# Patient Record
Sex: Male | Born: 1970 | Race: White | Hispanic: No | Marital: Single | Smoking: Current every day smoker
Health system: Southern US, Community
[De-identification: ages and names within clinical notes are randomized; demographics above are authoritative.]

## PROBLEM LIST (undated history)

## (undated) DIAGNOSIS — N200 Calculus of kidney: Secondary | ICD-10-CM

## (undated) DIAGNOSIS — F419 Anxiety disorder, unspecified: Secondary | ICD-10-CM

## (undated) DIAGNOSIS — G709 Myoneural disorder, unspecified: Secondary | ICD-10-CM

## (undated) DIAGNOSIS — K219 Gastro-esophageal reflux disease without esophagitis: Secondary | ICD-10-CM

## (undated) DIAGNOSIS — M62831 Muscle spasm of calf: Secondary | ICD-10-CM

## (undated) DIAGNOSIS — I10 Essential (primary) hypertension: Secondary | ICD-10-CM

## (undated) DIAGNOSIS — E7800 Pure hypercholesterolemia, unspecified: Secondary | ICD-10-CM

## (undated) HISTORY — PX: HAMSTRING LENGTHENING: SHX1722

## (undated) HISTORY — PX: OTHER SURGICAL HISTORY: SHX169

---

## 2003-03-28 ENCOUNTER — Encounter: Payer: Self-pay | Admitting: Emergency Medicine

## 2003-03-28 ENCOUNTER — Emergency Department (HOSPITAL_COMMUNITY): Admission: EM | Admit: 2003-03-28 | Discharge: 2003-03-28 | Payer: Self-pay | Admitting: *Deleted

## 2009-06-15 ENCOUNTER — Emergency Department: Payer: Self-pay | Admitting: Emergency Medicine

## 2011-05-13 ENCOUNTER — Emergency Department (INDEPENDENT_AMBULATORY_CARE_PROVIDER_SITE_OTHER)
Admission: EM | Admit: 2011-05-13 | Discharge: 2011-05-13 | Disposition: A | Discharge status: Home / Self Care | Payer: Self-pay | Source: Home / Self Care | Attending: Emergency Medicine | Admitting: Emergency Medicine

## 2011-05-13 ENCOUNTER — Emergency Department (INDEPENDENT_AMBULATORY_CARE_PROVIDER_SITE_OTHER): Payer: Self-pay

## 2011-05-13 DIAGNOSIS — J40 Bronchitis, not specified as acute or chronic: Secondary | ICD-10-CM

## 2011-05-13 DIAGNOSIS — J329 Chronic sinusitis, unspecified: Secondary | ICD-10-CM

## 2011-05-13 HISTORY — DX: Essential (primary) hypertension: I10

## 2011-05-13 HISTORY — DX: Muscle spasm of calf: M62.831

## 2011-05-13 HISTORY — DX: Pure hypercholesterolemia, unspecified: E78.00

## 2011-05-13 HISTORY — DX: Anxiety disorder, unspecified: F41.9

## 2011-05-13 MED ORDER — ALBUTEROL SULFATE (5 MG/ML) 0.5% IN NEBU
INHALATION_SOLUTION | RESPIRATORY_TRACT | Status: AC
  Filled 2011-05-13: qty 1

## 2011-05-13 MED ORDER — PSEUDOEPHEDRINE-GUAIFENESIN ER 120-1200 MG PO TB12: 1.0000 | ORAL_TABLET | Freq: Two times a day (BID) | ORAL | Status: DC

## 2011-05-13 MED ORDER — IBUPROFEN 800 MG PO TABS
ORAL_TABLET | ORAL | Status: AC
  Filled 2011-05-13: qty 1

## 2011-05-13 MED ORDER — BENZONATATE 100 MG PO CAPS: 100.0000 mg | ORAL_CAPSULE | Freq: Two times a day (BID) | ORAL | Status: AC | PRN

## 2011-05-13 MED ORDER — IBUPROFEN 800 MG PO TABS
800.0000 mg | ORAL_TABLET | Freq: Once | ORAL | Status: AC
  Administered 2011-05-13: 800 mg via ORAL

## 2011-05-13 MED ORDER — DOXYCYCLINE HYCLATE 100 MG PO CAPS: 100.0000 mg | ORAL_CAPSULE | Freq: Two times a day (BID) | ORAL | Status: AC

## 2011-05-13 MED ORDER — IBUPROFEN 600 MG PO TABS: 600.0000 mg | ORAL_TABLET | Freq: Four times a day (QID) | ORAL | Status: AC | PRN

## 2011-05-13 MED ORDER — ALBUTEROL SULFATE (5 MG/ML) 0.5% IN NEBU
5.0000 mg | INHALATION_SOLUTION | Freq: Once | RESPIRATORY_TRACT | Status: AC
  Administered 2011-05-13: 5 mg via RESPIRATORY_TRACT

## 2011-05-13 MED ORDER — ALBUTEROL SULFATE HFA 108 (90 BASE) MCG/ACT IN AERS: 1.0000 | INHALATION_SPRAY | Freq: Four times a day (QID) | RESPIRATORY_TRACT | Status: DC | PRN

## 2011-05-13 MED ORDER — IPRATROPIUM BROMIDE 0.02 % IN SOLN
0.5000 mg | Freq: Once | RESPIRATORY_TRACT | Status: AC
  Administered 2011-05-13: 0.5 mg via RESPIRATORY_TRACT

## 2011-05-13 NOTE — ED Notes (Signed)
This past Monday pt started having a dry cough.  Kept pt up all night.  Tuesday started with low grade fever and every day fever increased and yesterday fever was up to 103.  Pt found some old amoxicillin and started taking them yesterday morning.  Pain in head across eyes.  Pain in throat when coughing.

## 2011-05-13 NOTE — ED Provider Notes (Signed)
History     CSN: [id number] Arrival date & time: 05/13/2011  2:27 PM   First MD Initiated Contact with Patient 05/13/11 1307      Chief Complaint  Patient presents with  . Cough  . Chest Pain  . Fever   HPI Comments: Pt with sinus congestion, frontal HA, rhinorrhea, postnasal drip, ST, nonproductive cough, chest tightness, malaise, chest congestion x 5 days. Unable to sleep at night secondary to cough. Chest soreness after coughing. Reports low grade fevers increasing to 103 yesterday.  No N/v, posttussive emesis, abd pain. Started taking some leftover amoxicillin 500 mg po bid x 2 days, has run out. Last dose this am, Tried fmaily member's albuterol with improvement in chest tightness.. Also taking antihistamine for nasal congestion w/o relief.    Patient is a 40 y.o. male presenting with cough. The history is provided by the patient.  Cough This is a new problem. The current episode started more than 2 days ago. The problem has been gradually worsening. The cough is non-productive. The maximum temperature recorded prior to his arrival was 102 to 102.9 F. Associated symptoms include chest pain, headaches, rhinorrhea, sore throat, myalgias, shortness of breath and wheezing. Pertinent negatives include no chills, no sweats, no ear congestion, no ear pain and no eye redness. He has tried decongestants for the symptoms. The treatment provided no relief. He is not a smoker.    Past Medical History  Diagnosis Date  . Hypertension   . High cholesterol   . Anxiety   . Muscle spasm of calf     Past Surgical History  Procedure Date  . Hamstring lengthening     History reviewed. No pertinent family history.  History  Substance Use Topics  . Smoking status: Never Smoker   . Smokeless tobacco: Current User  . Alcohol Use: No      Review of Systems  Constitutional: Positive for fever. Negative for chills.  HENT: Positive for sore throat, rhinorrhea, sneezing, postnasal drip and  sinus pressure. Negative for ear pain, mouth sores, dental problem and voice change.   Eyes: Negative for redness.  Respiratory: Positive for cough, chest tightness, shortness of breath and wheezing.   Cardiovascular: Positive for chest pain. Negative for palpitations.  Gastrointestinal: Negative for nausea, vomiting, abdominal pain and diarrhea.  Musculoskeletal: Positive for myalgias.  Skin: Negative for rash.  Neurological: Positive for headaches.    Allergies  Review of patient's allergies indicates no known allergies.  Home Medications   Current Outpatient Rx  Name Route Sig Dispense Refill  . ALPRAZOLAM 1 MG PO TABS Oral Take 1 mg by mouth once.      . ATENOLOL PO Oral Take by mouth.      Marland Kitchen BACLOFEN PO Oral Take by mouth.      . FENOFIBRATE PO Oral Take by mouth.      Marland Kitchen LISINOPRIL 20 MG PO TABS Oral Take 20 mg by mouth daily.      . ALBUTEROL SULFATE HFA 108 (90 BASE) MCG/ACT IN AERS Inhalation Inhale 1-2 puffs into the lungs every 6 (six) hours as needed for wheezing. 1 Inhaler 0  . BENZONATATE 100 MG PO CAPS Oral Take 1 capsule (100 mg total) by mouth 2 (two) times daily as needed for cough. 21 capsule 0  . DOXYCYCLINE HYCLATE 100 MG PO CAPS Oral Take 1 capsule (100 mg total) by mouth 2 (two) times daily. X 10 days 20 capsule 0  . IBUPROFEN 600 MG PO TABS Oral  Take 1 tablet (600 mg total) by mouth every 6 (six) hours as needed for pain. 30 tablet 0  . PSEUDOEPHEDRINE-GUAIFENESIN [phone] MG PO TB12 Oral Take 1 tablet by mouth 2 (two) times daily. Prn congestion 20 each 0    BP 115/75  Pulse 89  Temp(Src) 98.4 F (36.9 C) (Oral)  Resp 16  SpO2 99%  Physical Exam  Nursing note and vitals reviewed. Constitutional: He is oriented to person, place, and time. He appears well-developed and well-nourished.  HENT:  Head: Normocephalic and atraumatic.  Right Ear: Tympanic membrane normal.  Left Ear: Tympanic membrane normal.  Nose: Mucosal edema and rhinorrhea present. Right  sinus exhibits frontal sinus tenderness. Right sinus exhibits no maxillary sinus tenderness. Left sinus exhibits frontal sinus tenderness. Left sinus exhibits no maxillary sinus tenderness.  Mouth/Throat: Uvula is midline and mucous membranes are normal. Posterior oropharyngeal erythema present. No oropharyngeal exudate or tonsillar abscesses.  Eyes: Conjunctivae and EOM are normal. Pupils are equal, round, and reactive to light.  Neck: Normal range of motion.  Cardiovascular: Normal rate, regular rhythm, normal heart sounds and intact distal pulses.   Pulmonary/Chest: Effort normal and breath sounds normal. No respiratory distress. He has no wheezes. He has no rales. He exhibits no tenderness.       Coughing, prolonged expiration  Abdominal: He exhibits no distension.  Musculoskeletal: Normal range of motion. He exhibits no edema.  Lymphadenopathy:    He has no cervical adenopathy.  Neurological: He is alert and oriented to person, place, and time.  Skin: Skin is warm and dry.  Psychiatric: He has a normal mood and affect. His behavior is normal.    ED Course  Procedures (including critical care time)  Labs Reviewed - No data to display Dg Chest 2 View  05/13/2011  *RADIOLOGY REPORT*  Clinical Data: Fever and cough.  CHEST - 2 VIEW  Comparison: None.  Findings: Lungs are clear.  Heart size is normal.  No pneumothorax or pleural effusion.  No focal bony abnormality.  IMPRESSION: Negative chest.  Original Report Authenticated By: Bernadene Bell. Maricela Curet, M.D.   The patient was given the following meds in the [facility]: Albuterol/atrivent, motrin   On re-evaluation, pt feels much improved after medication, pt comfortable, VSS. Improved air movement, no wheezing on repeat physical exam.   Discussed imaging results with patient. Emphasized importance of f/u. Pt agrees.    MDM      Luiz Blare, MD 05/13/11 [phone]

## 2011-08-13 ENCOUNTER — Emergency Department (HOSPITAL_COMMUNITY): Payer: Self-pay

## 2011-08-13 ENCOUNTER — Emergency Department (HOSPITAL_COMMUNITY)
Admission: EM | Admit: 2011-08-13 | Discharge: 2011-08-13 | Disposition: A | Discharge status: Home Health | Payer: Self-pay | Attending: Emergency Medicine | Admitting: Emergency Medicine

## 2011-08-13 ENCOUNTER — Encounter (HOSPITAL_COMMUNITY): Payer: Self-pay | Admitting: Emergency Medicine

## 2011-08-13 DIAGNOSIS — I10 Essential (primary) hypertension: Secondary | ICD-10-CM | POA: Insufficient documentation

## 2011-08-13 DIAGNOSIS — E7800 Pure hypercholesterolemia, unspecified: Secondary | ICD-10-CM | POA: Insufficient documentation

## 2011-08-13 DIAGNOSIS — R110 Nausea: Secondary | ICD-10-CM | POA: Insufficient documentation

## 2011-08-13 DIAGNOSIS — R109 Unspecified abdominal pain: Secondary | ICD-10-CM | POA: Insufficient documentation

## 2011-08-13 DIAGNOSIS — N200 Calculus of kidney: Secondary | ICD-10-CM | POA: Insufficient documentation

## 2011-08-13 LAB — DIFFERENTIAL
Basophils Relative: 0 % (ref 0–1)
Eosinophils Absolute: 0.1 10*3/uL (ref 0.0–0.7)
Eosinophils Relative: 1 % (ref 0–5)
Lymphs Abs: 2 10*3/uL (ref 0.7–4.0)
Monocytes Absolute: 1 10*3/uL (ref 0.1–1.0)
Monocytes Relative: 9 % (ref 3–12)

## 2011-08-13 LAB — URINALYSIS, ROUTINE W REFLEX MICROSCOPIC
Ketones, ur: 15 mg/dL — AB
Nitrite: NEGATIVE
Specific Gravity, Urine: 1.029 (ref 1.005–1.030)
pH: 5.5 (ref 5.0–8.0)

## 2011-08-13 LAB — CBC
Hemoglobin: 15.4 g/dL (ref 13.0–17.0)
MCH: 29.9 pg (ref 26.0–34.0)
MCHC: 36.6 g/dL — ABNORMAL HIGH (ref 30.0–36.0)
MCV: 81.7 fL (ref 78.0–100.0)
RBC: 5.15 MIL/uL (ref 4.22–5.81)

## 2011-08-13 LAB — POCT I-STAT, CHEM 8
Calcium, Ion: 1.26 mmol/L (ref 1.12–1.32)
Creatinine, Ser: 1 mg/dL (ref 0.50–1.35)
Glucose, Bld: 121 mg/dL — ABNORMAL HIGH (ref 70–99)
HCT: 45 % (ref 39.0–52.0)
Hemoglobin: 15.3 g/dL (ref 13.0–17.0)
Potassium: 3.9 mEq/L (ref 3.5–5.1)
TCO2: 22 mmol/L (ref 0–100)

## 2011-08-13 LAB — URINE MICROSCOPIC-ADD ON

## 2011-08-13 MED ORDER — TAMSULOSIN HCL 0.4 MG PO CAPS: 0.4000 mg | ORAL_CAPSULE | Freq: Every day | ORAL | Status: DC

## 2011-08-13 MED ORDER — SODIUM CHLORIDE 0.9 % IV BOLUS (SEPSIS)
1000.0000 mL | Freq: Once | INTRAVENOUS | Status: AC
  Administered 2011-08-13: 1000 mL via INTRAVENOUS

## 2011-08-13 MED ORDER — OXYCODONE-ACETAMINOPHEN 5-325 MG PO TABS
1.0000 | ORAL_TABLET | Freq: Four times a day (QID) | ORAL | Status: DC | PRN
Start: 2011-08-13 — End: 2011-08-16

## 2011-08-13 MED ORDER — HYDROMORPHONE HCL PF 1 MG/ML IJ SOLN
1.0000 mg | Freq: Once | INTRAMUSCULAR | Status: AC
  Administered 2011-08-13: 1 mg via INTRAVENOUS
  Filled 2011-08-13: qty 1

## 2011-08-13 MED ORDER — ONDANSETRON HCL 4 MG/2ML IJ SOLN
4.0000 mg | Freq: Once | INTRAMUSCULAR | Status: AC
  Administered 2011-08-13: 4 mg via INTRAVENOUS
  Filled 2011-08-13: qty 2

## 2011-08-13 NOTE — Discharge Instructions (Signed)
Please read and follow all provided instructions.  Your diagnoses today include:  1. Kidney stone     Tests performed today include:  Vital signs. See below for your results today.   CT scan which showed a 9mm x 6mm x 3mm kidney stone on the right side  Blood counts and electrolytes  Kidney function that is normal  Medications prescribed:   Percocet (oxycodone/acetaminophen) - narcotic pain medication  Flomax - smooth muscle relaxer to help you pass stone  Ibuprofen - anti-inflammatory pain medication  Do not exceed 800mg  ibuprofen every 8 hours  If you have been prescribed narcotic pain medication such as Vicodin or Percocet: DO NOT drive or perform any activities that require you to be awake and alert because this medicine can make you drowsy. Do not take any other medications that contain tylenol while taking this medication because you might take too much.   Take any prescribed medications only as directed.  Home care instructions:  Use strainer. Follow any provided educational materials.   Follow-up instructions: Call urologist in the next 3 days for further evaluation of your symptoms. If you do not have a primary care doctor -- see below for referral information.   Return instructions:   Please return to the Emergency Department if you experience worsening symptoms.   Please return if you have any other emergent concerns.  Additional Information:  Your vital signs today were: BP 101/71  Pulse 97  Temp(Src) 98.9 F (37.2 C) (Oral)  Resp 16  Ht 5\' 4"  (1.626 m)  Wt 168 lb (76.204 kg)  BMI 28.84 kg/m2  SpO2 97% If your blood pressure (BP) was elevated above 135/85 this visit, please have this repeated by your doctor within one month. -------------- No Primary Care Doctor Call [facility]  [phone] Other agencies that provide inexpensive medical care    [facility]  [phone]    [facility]  [phone]    [facility]  [phone]    [facility]  [phone]    [facility]  [phone]    [facility]  [phone] -------------- RESOURCE GUIDE:  Dental Problems  Patients with Medicaid: [facility] [phone] [address]                                            [phone] [address] Phone:  [phone]                                                   Phone:  [phone]  If unable to pay or uninsured, contact:  [facility] or [facility] to become qualified for the adult dental clinic.  Chronic Pain Problems Contact [facility]  [phone] Patients need to be referred by their primary care doctor.  Insufficient Money for Medicine Contact [employer]:  call "211" or [facility] [phone].  Psychological Services [facility]  [phone]  [phone] [facility] Mental [facility]   [phone] (emergency services [phone])  Substance Abuse Resources [facility]  [phone] [facility] [phone] [facility] [place] [phone] Floydene Flock [phone] Residential & Outpatient Substance Abuse Program  [phone]  Abuse/Neglect [facility] Child Abuse Hotline 513-716-4844 [facility] Child Abuse Hotline [phone] (After Hours)  Emergency Shelter [facility] [phone]  Maternity Homes Room at the [place] [facility] [place] [phone] [facility] [phone]  [facility]  [facility]     [employer]                          [facility] Dept. [address] [place]                       [address]      [address]  Blondell Reveal Phone:  [phone]                                   Phone:  [phone]                 Phone:   [phone]  [facility] Mental Health Phone:  [phone]  [facility] Child Abuse Hotline [phone] [phone] (After Hours)

## 2011-08-13 NOTE — ED Provider Notes (Signed)
Medical screening examination/treatment/procedure(s) were performed by non-physician practitioner and as supervising physician I was immediately available for consultation/collaboration.  Jasmine Awe, MD 08/13/11 [phone]

## 2011-08-13 NOTE — ED Notes (Signed)
MD at bedside. Josh PA with patient

## 2011-08-13 NOTE — ED Provider Notes (Signed)
History     CSN: [id number]  Arrival date & time 08/13/11  0621   First MD Initiated Contact with Patient 08/13/11 [phone]      Chief Complaint  Patient presents with  . Flank Pain    right flank pain since yesterday.    (Consider location/radiation/quality/duration/timing/severity/associated sxs/prior treatment) HPI Comments: Patient presents with flank pain since yesterday afternoon. Patient complains of right flank pain without radiation that is gradually worsening and waxing and waning. He has taken ibuprofen with some relief. He has a history of kidney stone 5 or 6 years ago. This feels similar. Patient had similar pain that was less intense 5 days ago which resolved. Patient noted some blood in the urine then but has not had any recently. Patient denies burning with urination. He has had some nausea but no vomiting. He denies fever.  Patient is a 41 y.o. male presenting with flank pain. The history is provided by the patient.  Flank Pain This is a new problem. The current episode started yesterday. The problem occurs constantly. The problem has been waxing and waning. Associated symptoms include nausea. Pertinent negatives include no abdominal pain, change in bowel habit, chest pain, chills, congestion, coughing, fever, headaches, myalgias, rash, sore throat, urinary symptoms or vomiting. The symptoms are aggravated by nothing. He has tried NSAIDs for the symptoms. The treatment provided moderate relief.    Past Medical History  Diagnosis Date  . Hypertension   . High cholesterol   . Anxiety   . Muscle spasm of calf     Past Surgical History  Procedure Date  . Hamstring lengthening     History reviewed. No pertinent family history.  History  Substance Use Topics  . Smoking status: Former Games developer  . Smokeless tobacco: Current User  . Alcohol Use: No      Review of Systems  Constitutional: Negative for fever and chills.  HENT: Negative for congestion, sore throat and  rhinorrhea.   Eyes: Negative for redness.  Respiratory: Negative for cough.   Cardiovascular: Negative for chest pain.  Gastrointestinal: Positive for nausea. Negative for vomiting, abdominal pain, diarrhea and change in bowel habit.  Genitourinary: Positive for flank pain. Negative for dysuria, urgency, hematuria, difficulty urinating and testicular pain.  Musculoskeletal: Negative for myalgias.  Skin: Negative for rash.  Neurological: Negative for headaches.    Allergies  Review of patient's allergies indicates no known allergies.  Home Medications   Current Outpatient Rx  Name Route Sig Dispense Refill  . ALBUTEROL SULFATE HFA 108 (90 BASE) MCG/ACT IN AERS Inhalation Inhale 1-2 puffs into the lungs every 6 (six) hours as needed for wheezing. 1 Inhaler 0  . ALPRAZOLAM 1 MG PO TABS Oral Take 1 mg by mouth 3 (three) times daily as needed. anxiety    . ATENOLOL 25 MG PO TABS Oral Take 25 mg by mouth daily.    . CYCLOBENZAPRINE HCL 10 MG PO TABS Oral Take 10 mg by mouth at bedtime. Muscle spasms     . FENOFIBRATE 160 MG PO TABS Oral Take 160 mg by mouth daily.    Marland Kitchen LISINOPRIL-HYDROCHLOROTHIAZIDE 20-25 MG PO TABS Oral Take 1 tablet by mouth daily.    Marland Kitchen PSEUDOEPHEDRINE-GUAIFENESIN ER [phone] MG PO TB12 Oral Take 1 tablet by mouth 2 (two) times daily. Prn congestion 20 each 0    BP 146/106  Pulse 61  Temp(Src) 98.9 F (37.2 C) (Oral)  Resp 18  Ht 5\' 4"  (1.626 m)  Wt 168 lb (76.204 kg)  BMI 28.84 kg/m2  SpO2 99%  Physical Exam  Nursing note and vitals reviewed. Constitutional: He is oriented to person, place, and time. He appears well-developed and well-nourished.  HENT:  Head: Normocephalic and atraumatic.  Eyes: Conjunctivae are normal. Right eye exhibits no discharge. Left eye exhibits no discharge.  Neck: Normal range of motion. Neck supple.  Cardiovascular: Normal rate, regular rhythm and normal heart sounds.   Pulmonary/Chest: Effort normal and breath sounds normal.    Abdominal: Soft. Bowel sounds are normal. There is no tenderness. There is no rebound and no guarding.  Neurological: He is alert and oriented to person, place, and time.  Skin: Skin is warm and dry.  Psychiatric: He has a normal mood and affect.    ED Course  Procedures (including critical care time)  Labs Reviewed  URINALYSIS, ROUTINE W REFLEX MICROSCOPIC - Abnormal; Notable for the following:    Color, Urine AMBER (*) BIOCHEMICALS MAY BE AFFECTED BY COLOR   APPearance CLOUDY (*)    Hgb urine dipstick LARGE (*)    Bilirubin Urine SMALL (*)    Ketones, ur 15 (*)    Protein, ur 30 (*)    Leukocytes, UA SMALL (*)    All other components within normal limits  CBC - Abnormal; Notable for the following:    WBC 11.4 (*)    MCHC 36.6 (*)    All other components within normal limits  DIFFERENTIAL - Abnormal; Notable for the following:    Neutro Abs 8.4 (*)    All other components within normal limits  POCT I-STAT, CHEM 8 - Abnormal; Notable for the following:    Glucose, Bld 121 (*)    All other components within normal limits  URINE MICROSCOPIC-ADD ON - Abnormal; Notable for the following:    Casts HYALINE CASTS (*)    Crystals CA OXALATE CRYSTALS (*)    All other components within normal limits   Ct Abdomen Pelvis Wo Contrast  08/13/2011  *RADIOLOGY REPORT*  Clinical Data: Right flank pain.  History of renal calculi. Hypertension.  CT ABDOMEN AND PELVIS WITHOUT CONTRAST  Technique:  Multidetector CT imaging of the abdomen and pelvis was performed following the standard protocol without intravenous contrast.  Comparison: None.  Findings: There is evidence of old granulomatous disease in the lung bases. The visualized portion of the liver, spleen, pancreas, and adrenal glands appear unremarkable in noncontrast CT appearance.  The gallbladder and biliary system appear unremarkable.  Mild right hydronephrosis and proximal hydroureter are present due to a 0.9 x 0.3 x 0.6 cm right proximal  ureteral calculus shown on image 49 of series 602.  The ureter distal to this stone is of normal caliber, and no left-sided calculi are observed.  Retrocecal appendix extends cephalad and terminates at the inferior tip of the liver; the appendix appears normal.  No pathologic retroperitoneal or porta hepatis adenopathy is identified.  Scattered air-fluid levels in nondilated small bowel are probably incidental  Mild atherosclerotic calcification of the abdominal aorta is present.  No ascites noted.  Bilateral L5 pars interarticularis defects are present, with 4 mm of grade 1 anterolisthesis of L5 on S1 and potential right greater than left foraminal stenosis.  Small central calcifications are present in the prostate gland.  IMPRESSION:  1.  Mildly obstructive right proximal ureteral calculus measuring 0.9 x 0.3 x 0.6 cm. 2.  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  Original Report Authenticated By: Dellia Cloud, M.D.  1. Kidney stone     6:52 AM Patient seen and examined. Work-up initiated. Medications ordered. Patient is not in acute distress currently.   Vital signs reviewed and are as follows: Filed Vitals:   08/13/11 0623  BP: 146/106  Pulse: 61  Temp: 98.9 F (37.2 C)  Resp: 18   9:18 AM Patient d/w Dr. Judd Lien. Patient with large stone on CT. Pain is controlled after one dose of Dilaudid. Patient informed of all results today. Discussed the need for urology followup. Urged patient to strain urine at home. Patient urged to return with worsening severe pain, persistent pain, persistent vomiting, fever, or other concerns. He verbalizes understanding and agrees with the plan.  9:20 AM Patient counseled on use of narcotic pain medications. Counseled not to combine these medications with others containing tylenol. Urged not to drink alcohol, drive, or perform any other activities that requires focus while taking these medications. The patient verbalizes understanding  and agrees with the plan.   MDM  Patient with large, mildly obstructing small right ureteral stone. Pain and nausea controlled well in the emergency department. Renal function is normal. Patient will need urology followup given high probability of being unable to pass the stone. Workup was otherwise unconcerning. Patient is stable at time of discharge home.        Eustace Moore [place], [place] 08/13/11 [phone]

## 2011-08-13 NOTE — ED Notes (Signed)
Assumed patient's care, Scott Marks, skin warm and dry, respiration is even and unlabored. Scott denies any pain at present.

## 2011-08-16 ENCOUNTER — Encounter (HOSPITAL_COMMUNITY): Payer: Self-pay | Admitting: Anesthesiology

## 2011-08-16 ENCOUNTER — Encounter (HOSPITAL_COMMUNITY): Payer: Self-pay | Admitting: *Deleted

## 2011-08-16 ENCOUNTER — Other Ambulatory Visit: Payer: Self-pay | Admitting: Urology

## 2011-08-16 ENCOUNTER — Ambulatory Visit: Admit: 2011-08-16 | Payer: Self-pay | Admitting: Urology

## 2011-08-16 ENCOUNTER — Encounter (HOSPITAL_COMMUNITY)
Admission: EM | Disposition: A | Discharge status: Home / Self Care | Payer: Self-pay | Source: Home / Self Care | Attending: Urology

## 2011-08-16 ENCOUNTER — Inpatient Hospital Stay (HOSPITAL_COMMUNITY): Admission: AD | Admit: 2011-08-16 | Payer: Self-pay | Source: Ambulatory Visit | Admitting: Urology

## 2011-08-16 ENCOUNTER — Inpatient Hospital Stay (HOSPITAL_COMMUNITY): Payer: Self-pay | Admitting: Anesthesiology

## 2011-08-16 ENCOUNTER — Inpatient Hospital Stay (HOSPITAL_COMMUNITY)
Admission: EM | Admit: 2011-08-16 | Discharge: 2011-08-17 | DRG: 694 | Disposition: A | Discharge status: Home / Self Care | Payer: Self-pay | Attending: Urology | Admitting: Urology

## 2011-08-16 DIAGNOSIS — E7800 Pure hypercholesterolemia, unspecified: Secondary | ICD-10-CM | POA: Diagnosis present

## 2011-08-16 DIAGNOSIS — R52 Pain, unspecified: Secondary | ICD-10-CM | POA: Diagnosis present

## 2011-08-16 DIAGNOSIS — N201 Calculus of ureter: Principal | ICD-10-CM | POA: Diagnosis present

## 2011-08-16 DIAGNOSIS — I10 Essential (primary) hypertension: Secondary | ICD-10-CM | POA: Diagnosis present

## 2011-08-16 DIAGNOSIS — Z79899 Other long term (current) drug therapy: Secondary | ICD-10-CM

## 2011-08-16 DIAGNOSIS — F411 Generalized anxiety disorder: Secondary | ICD-10-CM | POA: Diagnosis present

## 2011-08-16 DIAGNOSIS — N1330 Unspecified hydronephrosis: Secondary | ICD-10-CM | POA: Diagnosis present

## 2011-08-16 HISTORY — PX: CYSTOSCOPY W/ URETERAL STENT PLACEMENT: SHX1429

## 2011-08-16 HISTORY — DX: Calculus of kidney: N20.0

## 2011-08-16 LAB — URINALYSIS, ROUTINE W REFLEX MICROSCOPIC
Ketones, ur: NEGATIVE mg/dL
Leukocytes, UA: NEGATIVE
Nitrite: NEGATIVE
pH: 5.5 (ref 5.0–8.0)

## 2011-08-16 LAB — POCT I-STAT, CHEM 8
BUN: 20 mg/dL (ref 6–23)
HCT: 49 % (ref 39.0–52.0)
Hemoglobin: 16.7 g/dL (ref 13.0–17.0)
Sodium: 141 mEq/L (ref 135–145)
TCO2: 24 mmol/L (ref 0–100)

## 2011-08-16 LAB — URINE MICROSCOPIC-ADD ON

## 2011-08-16 SURGERY — CYSTOURETEROSCOPY, WITH RETROGRADE PYELOGRAM AND STENT INSERTION
Anesthesia: General | Laterality: Right

## 2011-08-16 SURGERY — CYSTOSCOPY, WITH RETROGRADE PYELOGRAM AND URETERAL STENT INSERTION
Anesthesia: General | Site: Ureter | Laterality: Right | Wound class: Clean Contaminated

## 2011-08-16 MED ORDER — SODIUM CHLORIDE 0.9 % IV SOLN
Freq: Once | INTRAVENOUS | Status: AC
  Administered 2011-08-16: 75 mL/h via INTRAVENOUS

## 2011-08-16 MED ORDER — HYDROMORPHONE HCL PF 1 MG/ML IJ SOLN
0.5000 mg | INTRAMUSCULAR | Status: DC | PRN
  Administered 2011-08-16: 1 mg via INTRAVENOUS
  Filled 2011-08-16: qty 1

## 2011-08-16 MED ORDER — IOHEXOL 300 MG/ML  SOLN
INTRAMUSCULAR | Status: DC | PRN
  Administered 2011-08-16: 6 mL

## 2011-08-16 MED ORDER — CEFAZOLIN SODIUM-DEXTROSE 2-3 GM-% IV SOLR
2.0000 g | Freq: Once | INTRAVENOUS | Status: DC
  Filled 2011-08-16: qty 50

## 2011-08-16 MED ORDER — FENTANYL CITRATE 0.05 MG/ML IJ SOLN
25.0000 ug | INTRAMUSCULAR | Status: DC | PRN
  Administered 2011-08-16: 25 ug via INTRAVENOUS

## 2011-08-16 MED ORDER — LISINOPRIL-HYDROCHLOROTHIAZIDE 20-25 MG PO TABS: 1.0000 | ORAL_TABLET | Freq: Every day | ORAL | Status: DC

## 2011-08-16 MED ORDER — CEFAZOLIN SODIUM 1-5 GM-% IV SOLN
INTRAVENOUS | Status: DC | PRN
  Administered 2011-08-16: 1 g via INTRAVENOUS

## 2011-08-16 MED ORDER — LISINOPRIL 20 MG PO TABS
20.0000 mg | ORAL_TABLET | Freq: Every day | ORAL | Status: DC
  Filled 2011-08-16: qty 1

## 2011-08-16 MED ORDER — HYDROCHLOROTHIAZIDE 25 MG PO TABS
25.0000 mg | ORAL_TABLET | Freq: Every day | ORAL | Status: DC
  Filled 2011-08-16: qty 1

## 2011-08-16 MED ORDER — HYDROCODONE-ACETAMINOPHEN 5-325 MG PO TABS: 1.0000 | ORAL_TABLET | ORAL | Status: DC | PRN

## 2011-08-16 MED ORDER — DEXAMETHASONE SODIUM PHOSPHATE 10 MG/ML IJ SOLN
INTRAMUSCULAR | Status: DC | PRN
  Administered 2011-08-16: 10 mg via INTRAVENOUS

## 2011-08-16 MED ORDER — LACTATED RINGERS IV SOLN
INTRAVENOUS | Status: DC | PRN
  Administered 2011-08-16: 20:00:00 via INTRAVENOUS

## 2011-08-16 MED ORDER — KETOROLAC TROMETHAMINE 30 MG/ML IJ SOLN
30.0000 mg | Freq: Four times a day (QID) | INTRAMUSCULAR | Status: DC
  Administered 2011-08-16 – 2011-08-17 (×3): 30 mg via INTRAVENOUS
  Filled 2011-08-16 (×11): qty 1

## 2011-08-16 MED ORDER — PROMETHAZINE HCL 25 MG/ML IJ SOLN
25.0000 mg | Freq: Once | INTRAMUSCULAR | Status: AC
  Administered 2011-08-16: 25 mg via INTRAVENOUS
  Filled 2011-08-16: qty 1

## 2011-08-16 MED ORDER — ALPRAZOLAM 1 MG PO TABS
1.0000 mg | ORAL_TABLET | Freq: Three times a day (TID) | ORAL | Status: DC | PRN
  Administered 2011-08-16: 1 mg via ORAL
  Filled 2011-08-16: qty 1

## 2011-08-16 MED ORDER — FENOFIBRATE 160 MG PO TABS
160.0000 mg | ORAL_TABLET | Freq: Every day | ORAL | Status: DC
  Filled 2011-08-16: qty 1

## 2011-08-16 MED ORDER — DEXTROSE-NACL 5-0.45 % IV SOLN
INTRAVENOUS | Status: DC
  Administered 2011-08-16: 75 mL/h via INTRAVENOUS
  Administered 2011-08-17: 02:00:00 via INTRAVENOUS

## 2011-08-16 MED ORDER — HYDROMORPHONE HCL PF 1 MG/ML IJ SOLN
1.0000 mg | INTRAMUSCULAR | Status: DC | PRN
  Administered 2011-08-16 (×3): 1 mg via INTRAVENOUS
  Filled 2011-08-16 (×3): qty 1

## 2011-08-16 MED ORDER — FENTANYL CITRATE 0.05 MG/ML IJ SOLN
INTRAMUSCULAR | Status: DC | PRN
  Administered 2011-08-16: 100 ug via INTRAVENOUS

## 2011-08-16 MED ORDER — PROMETHAZINE HCL 25 MG/ML IJ SOLN: 6.2500 mg | INTRAMUSCULAR | Status: DC | PRN

## 2011-08-16 MED ORDER — CIPROFLOXACIN HCL 500 MG PO TABS
500.0000 mg | ORAL_TABLET | Freq: Two times a day (BID) | ORAL | Status: DC
  Administered 2011-08-16: 500 mg via ORAL
  Filled 2011-08-16 (×3): qty 1

## 2011-08-16 MED ORDER — ATENOLOL 25 MG PO TABS
25.0000 mg | ORAL_TABLET | Freq: Every day | ORAL | Status: DC
  Filled 2011-08-16: qty 1

## 2011-08-16 MED ORDER — CYCLOBENZAPRINE HCL 10 MG PO TABS
10.0000 mg | ORAL_TABLET | Freq: Every day | ORAL | Status: DC
  Filled 2011-08-16: qty 1

## 2011-08-16 MED ORDER — SODIUM CHLORIDE 0.9 % IV SOLN: Freq: Once | INTRAVENOUS | Status: DC

## 2011-08-16 MED ORDER — PROPOFOL 10 MG/ML IV BOLUS
INTRAVENOUS | Status: DC | PRN
  Administered 2011-08-16: 150 mg via INTRAVENOUS

## 2011-08-16 MED ORDER — ONDANSETRON HCL 4 MG/2ML IJ SOLN
INTRAMUSCULAR | Status: DC | PRN
  Administered 2011-08-16: 4 mg via INTRAVENOUS

## 2011-08-16 MED ORDER — ACETAMINOPHEN 10 MG/ML IV SOLN
INTRAVENOUS | Status: DC | PRN
  Administered 2011-08-16: 1000 mg via INTRAVENOUS

## 2011-08-16 MED ORDER — MIDAZOLAM HCL 5 MG/5ML IJ SOLN
INTRAMUSCULAR | Status: DC | PRN
  Administered 2011-08-16: 2 mg via INTRAVENOUS

## 2011-08-16 SURGICAL SUPPLY — 17 items
ADAPTER CATH URET PLST 4-6FR (CATHETERS) ×3 IMPLANT
ADPR CATH URET STRL DISP 4-6FR (CATHETERS) ×2
BAG URO CATCHER STRL LF (DRAPE) ×3 IMPLANT
Boston Scientific Contour (Stent) ×2 IMPLANT
CATH URET 5FR 28IN OPEN ENDED (CATHETERS) ×3 IMPLANT
CATH URET WHISTLE 6FR (CATHETERS) IMPLANT
CLOTH BEACON ORANGE TIMEOUT ST (SAFETY) ×3 IMPLANT
DRAPE CAMERA CLOSED 9X96 (DRAPES) ×3 IMPLANT
GLOVE BIOGEL M 8.0 STRL (GLOVE) ×3 IMPLANT
GLOVE SURG SS PI 8.0 STRL IVOR (GLOVE) ×3 IMPLANT
GOWN PREVENTION PLUS XLARGE (GOWN DISPOSABLE) ×3 IMPLANT
GOWN STRL REIN XL XLG (GOWN DISPOSABLE) ×3 IMPLANT
MANIFOLD NEPTUNE II (INSTRUMENTS) ×3 IMPLANT
MARKER SKIN DUAL TIP RULER LAB (MISCELLANEOUS) ×3 IMPLANT
PACK CYSTO (CUSTOM PROCEDURE TRAY) ×3 IMPLANT
STENT CONTOUR 6FRX24X.038 (STENTS) ×2 IMPLANT
TUBING CONNECTING 10 (TUBING) ×3 IMPLANT

## 2011-08-16 NOTE — ED Notes (Signed)
Pt knows we need a urine sample but is unable at this time 

## 2011-08-16 NOTE — ED Notes (Signed)
Patient with complaints of right sided pain.  Patient has reported large kidney stone. Patient with increased pain, nausea, and dizziness.  Patient was to see Dr Annabell Howells today but could not wait due to pain. Patient states he took percocet x 2 today at 0530 and 0830

## 2011-08-16 NOTE — Op Note (Signed)
Preoperative diagnosis: Obstructing right proximal ureteral stone Postoperative diagnosis: Same  Procedure: Cystoscopy, right retrograde ureteropyelogram, interpreting fluoroscopy, double J stent placement on the) 6 [place] by 24 centimeter contour without string   Surgeon: Bertram Millard. Deunta Beneke, M.D.  Anesthesia: Gen.  Indications: 41 year old male with symptomatic right proximal ureteral stone approximately 8-9 mm in size. Presented to the emergency room, and could not be made comfortable with IV narcotics. He presents at this time for definitive stent placement, most likely to be followed by lithotripsy later date. He is aware risks and complications of stent placement and desires to proceed.     Technique and findings: The patient was identified in the holding area and received preoperative IV antibiotics. He had previously been marked. He was taken the operating room where general anesthetic was administered. He was placed in the dorsolithotomy position. Genitalia and perineum were prepped and draped. Timeout was then performed.  A 22 French panendoscope was advanced under direct vision into the bladder. Urethra was normal, without lesions and there were no lesions within the bladder. Prostate was nonobstructive. The right ureteral orifice was cannulated with a 5 [place] open-ended catheter. Retrograde ureteropyelogram was performed.  The retrograde revealed a normal distal ureter, with a filling defect approximately 1/4 of the way down the ureter. Proximal this, where the stone was located, there was moderate hydronephrosis and pelvocaliectasis.  Following retrograde pyelogram, a sensor-tip guidewire was advanced easily by the obstructing stone, and over top of this, a 24 cm x 6 French contour double-J stent placed. Once adequate curls were seen proximally and distally with fluoroscopic and cystoscopic guidance, the scope was removed once the bladder was drained.  The patient tolerated the  procedure well. He is awakened and taken to PACU in stable condition.

## 2011-08-16 NOTE — ED Notes (Signed)
[facility] at bedside. Report given to [facility] EMT

## 2011-08-16 NOTE — ED Provider Notes (Signed)
History     CSN: [id number]  Arrival date & time 08/16/11  0919   First MD Initiated Contact with Patient 08/16/11 0932      Chief Complaint  Patient presents with  . Nephrolithiasis   Level V caveat for urgent need for intervention because of pain  (Consider location/radiation/quality/duration/timing/severity/associated sxs/prior treatment) HPI Patient relates he was seen here a couple days ago for a right kidney stone. He has appointment today at 11 AM with Dr. Annabell Howells he relates however he started having severe pain at 4 AM this morning in his right flank it's radiating to his right lower quadrant with nausea but no vomiting. He states he's had small kidney stones in the past. He states he took Percocet at 5 AM and 8 AM without improvement of his discomfort.   PCP Dr. Della Goo   Past Medical History  Diagnosis Date  . Hypertension   . High cholesterol   . Anxiety   . Muscle spasm of calf   . Kidney stone     Past Surgical History  Procedure Date  . Hamstring lengthening     No family history on file.  History  Substance Use Topics  . Smoking status: Former Games developer  . Smokeless tobacco: Current User  . Alcohol Use: No  lives with spouse   Review of Systems  Unable to perform ROS   Allergies  Review of patient's allergies indicates no known allergies.  Home Medications   Current Outpatient Rx  Name Route Sig Dispense Refill  . ALPRAZOLAM 1 MG PO TABS Oral Take 1 mg by mouth 3 (three) times daily as needed. anxiety    . ATENOLOL 25 MG PO TABS Oral Take 25 mg by mouth daily.    . CYCLOBENZAPRINE HCL 10 MG PO TABS Oral Take 10 mg by mouth at bedtime. Muscle spasms     . FENOFIBRATE 160 MG PO TABS Oral Take 160 mg by mouth daily.    Marland Kitchen LISINOPRIL-HYDROCHLOROTHIAZIDE 20-25 MG PO TABS Oral Take 1 tablet by mouth daily.    . OXYCODONE-ACETAMINOPHEN 5-325 MG PO TABS Oral Take 1-2 tablets by mouth every 6 (six) hours as needed. For pain    . TAMSULOSIN HCL  0.4 MG PO CAPS Oral Take 1 capsule (0.4 mg total) by mouth daily. 7 capsule 0    BP 139/89  Pulse 59  Temp 98 F (36.7 C)  Resp 26  Ht 5\' 4"  (1.626 m)  Wt 165 lb (74.844 kg)  BMI 28.32 kg/m2  SpO2 100%  Vital signs normal    Physical Exam  Nursing note and vitals reviewed. Constitutional: He is oriented to person, place, and time. He appears well-developed and well-nourished.  Non-toxic appearance. He does not appear ill. He appears distressed.       Patient appears to be very painful, he is holding his right flank  HENT:  Head: Normocephalic and atraumatic.  Right Ear: External ear normal.  Left Ear: External ear normal.  Nose: Nose normal. No mucosal edema or rhinorrhea.  Mouth/Throat: Oropharynx is clear and moist and mucous membranes are normal. No dental abscesses or uvula swelling.  Eyes: Conjunctivae and EOM are normal. Pupils are equal, round, and reactive to light.  Neck: Normal range of motion and full passive range of motion without pain. Neck supple.  Pulmonary/Chest: Effort normal. No respiratory distress. He has no rhonchi. He exhibits no tenderness and no crepitus.  Abdominal: Normal appearance. There is no tenderness. There is no rebound and  no guarding.  Musculoskeletal: Normal range of motion. He exhibits no edema and no tenderness.       Moves all extremities well.   Neurological: He is alert and oriented to person, place, and time. He has normal strength. No cranial nerve deficit.  Skin: Skin is warm, dry and intact. No rash noted. No erythema. There is pallor.  Psychiatric: His speech is normal and behavior is normal. His mood appears not anxious.       Patient is distressed and appears  agitated    ED Course  Procedures (including critical care time)    Medications  oxyCODONE-acetaminophen (PERCOCET) 5-325 MG per tablet (not administered)  HYDROmorphone (DILAUDID) injection 1 mg (1 mg Intravenous Given 08/16/11 1122)  promethazine (PHENERGAN)  injection 25 mg (25 mg Intravenous Given 08/16/11 1004)  0.9 %  sodium chloride infusion (75 mL/hr Intravenous New Bag/Given 08/16/11 1003)  2nd dilaudid 1 mg IV also given  Pt continues to appear painful, holding his side.  12:03 Dr Annabell Howells accepts in transfer to [facility] for admission.   Results for orders placed during the hospital encounter of 08/16/11  POCT I-STAT, CHEM 8      Component Value Range   Sodium 141  135 - 145 (mEq/L)   Potassium 3.6  3.5 - 5.1 (mEq/L)   Chloride 106  96 - 112 (mEq/L)   BUN 20  6 - 23 (mg/dL)   Creatinine, Ser 1.61  0.50 - 1.35 (mg/dL)   Glucose, Bld 096 (*) 70 - 99 (mg/dL)   Calcium, Ion 0.45  4.09 - 1.32 (mmol/L)   TCO2 24  0 - 100 (mmol/L)   Hemoglobin 16.7  13.0 - 17.0 (g/dL)   HCT 81.1  91.4 - 78.2 (%)   Ct Abdomen Pelvis Wo Contrast  08/13/2011  *RADIOLOGY REPORT*  Clinical Data: Right flank pain.  History of renal calculi. Hypertension.  CT ABDOMEN AND PELVIS WITHOUT CONTRAST  Technique:  Multidetector CT imaging of the abdomen and pelvis was performed following the standard protocol without intravenous contrast.  Comparison: None.  Findings: There is evidence of old granulomatous disease in the lung bases. The visualized portion of the liver, spleen, pancreas, and adrenal glands appear unremarkable in noncontrast CT appearance.  The gallbladder and biliary system appear unremarkable.  Mild right hydronephrosis and proximal hydroureter are present due to a 0.9 x 0.3 x 0.6 cm right proximal ureteral calculus shown on image 49 of series 602.  The ureter distal to this stone is of normal caliber, and no left-sided calculi are observed.  Retrocecal appendix extends cephalad and terminates at the inferior tip of the liver; the appendix appears normal.  No pathologic retroperitoneal or porta hepatis adenopathy is identified.  Scattered air-fluid levels in nondilated small bowel are probably incidental  Mild atherosclerotic calcification of the abdominal aorta is  present.  No ascites noted.  Bilateral L5 pars interarticularis defects are present, with 4 mm of grade 1 anterolisthesis of L5 on S1 and potential right greater than left foraminal stenosis.  Small central calcifications are present in the prostate gland.  IMPRESSION:  1.  Mildly obstructive right proximal ureteral calculus measuring 0.9 x 0.3 x 0.6 cm. 2.  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  Original Report Authenticated By: Dellia Cloud, M.D.     1. Ureteral calculus, right     Transfer to [facility] for admission for stent placement  Devoria Albe, MD, FACEP  MDM  Ward Givens, MD 08/16/11 1216

## 2011-08-16 NOTE — ED Notes (Signed)
Pt awake and c/o pain, Dr. Lynelle Doctor was made aware, ordered to give the patient another dose of Dilaudid 1 mg IV prior to transfer.

## 2011-08-16 NOTE — ED Notes (Signed)
Pt was received to POD 3 with c/o rt flank pain radiating to RLQ area onset at 0430. Pt was seen here last Saturday and was diagnosed with kidney stone. Pt noted to be grimacing in pain and c/o nausea.

## 2011-08-16 NOTE — ED Notes (Signed)
Patient remains on monitor with NAD at this time. Patient resting with family at bedside. Patient and family aware of plan of care.

## 2011-08-16 NOTE — H&P (Signed)
Urology Admission H&P  Chief Complaint: Right nephrolithiasis  History of Present Illness: Pt is a 41 yo cauc male who was transferred to [facility] from [facility] ED for treatment of a known right ureteral stone.  Pt states he first began to note pain approx 1 week ago but did not present to the ED for initial eval until 3 days ago.  CT scan revealed a right ureteral stone with mild hydronephrosis and hydroureter.  He was given Flomax, Percocet, and a strainer and instructed to f/u with Dr. Annabell Howells in the office today.  Last night his pain became unbearable despite percocet, therefore, he re-presented to the ED at [facility].  His pain was controlled with Dilaudid and a urology eval was requested.  He has had intermittent nausea with no vomiting and denies F/C.  He has been voiding without difficulty and had only one episode of GH.  He had one stone 4-5 years ago that he passed without intervention.  His pain is currently controlled.  PMH is also significant for HTN, anxiety, and hyperlipidemia.  Past Medical History  Diagnosis Date  . Hypertension   . High cholesterol   . Anxiety   . Muscle spasm of calf   . Kidney stone   Cerbral palsy  Past Surgical History  Procedure Date  . Hamstring lengthening   Repair of testicular torsion  Home Medications:  Prescriptions prior to admission  Medication Sig Dispense Refill  . ALPRAZolam (XANAX) 1 MG tablet Take 1 mg by mouth 3 (three) times daily as needed. anxiety      . atenolol (TENORMIN) 25 MG tablet Take 25 mg by mouth daily.      . cyclobenzaprine (FLEXERIL) 10 MG tablet Take 10 mg by mouth at bedtime. Muscle spasms       . fenofibrate 160 MG tablet Take 160 mg by mouth daily.      Marland Kitchen lisinopril-hydrochlorothiazide (PRINZIDE,ZESTORETIC) 20-25 MG per tablet Take 1 tablet by mouth daily.      Marland Kitchen oxyCODONE-acetaminophen (PERCOCET) 5-325 MG per tablet Take 1-2 tablets by mouth every 6 (six) hours as needed. For pain      . Tamsulosin HCl (FLOMAX) 0.4 MG CAPS Take 1  capsule (0.4 mg total) by mouth daily.  7 capsule  0   Allergies: No Known Allergies  Family History: no hx of stones  Social History:  reports that he has quit smoking. He uses smokeless tobacco. He reports that he does not drink alcohol or use illicit drugs.  Review of Systems  Constitutional: Negative for fever, chills and weight loss.  HENT: Negative.   Eyes: Negative for blurred vision and double vision.  Respiratory: Negative for cough, shortness of breath and wheezing.   Cardiovascular: Negative for chest pain, palpitations and leg swelling.  Gastrointestinal: Positive for nausea and abdominal pain. Negative for vomiting, diarrhea, constipation and blood in stool.       Right sided abd pain  Genitourinary: Positive for hematuria and flank pain. Negative for dysuria, urgency and frequency.       One episode GH Flank pain right  Musculoskeletal: Negative.   Skin: Negative.   Neurological: Negative for dizziness, seizures, loss of consciousness and weakness.  Endo/Heme/Allergies: Negative.   Psychiatric/Behavioral: Negative for depression and hallucinations. The patient is nervous/anxious.     Physical Exam:  Vital signs in last 24 hours: Temp:  [97.5 F (36.4 C)-98 F (36.7 C)] 97.9 F (36.6 C) (02/26 1504) Pulse Rate:  [59-83] 71  (02/26 1504) Resp:  [  16-26] 16  (02/26 1504) BP: (110-139)/(75-93) 123/93 mmHg (02/26 1504) SpO2:  [96 %-100 %] 98 % (02/26 1504) Weight:  [74.844 kg (165 lb)-76.6 kg (168 lb 14 oz)] 76.6 kg (168 lb 14 oz) (02/26 1520) Physical Exam  Constitutional: He is oriented to person, place, and time. He appears well-developed and well-nourished. Distressed: mildly distressed.  HENT:  Head: Normocephalic and atraumatic.  Eyes: EOM are normal. Pupils are equal, round, and reactive to light.  Neck: Normal range of motion. Neck supple. No JVD present. No tracheal deviation present.  Cardiovascular: Normal rate, regular rhythm and normal heart sounds.   Exam reveals no friction rub.   No murmur heard. Respiratory: Effort normal and breath sounds normal. No respiratory distress. He has no wheezes.  GI: Soft. Bowel sounds are normal. He exhibits no distension and no mass. There is no tenderness. There is no rebound and no guarding.  Musculoskeletal: Normal range of motion.  Neurological: He is alert and oriented to person, place, and time. No cranial nerve deficit. Coordination normal.       Mentation slowed by narcotics  Skin: Skin is warm and dry. No rash noted.  Psychiatric: He has a normal mood and affect. His behavior is normal.    Laboratory Data:  Results for orders placed during the hospital encounter of 08/16/11 (from the past 24 hour(s))  POCT I-STAT, CHEM 8     Status: Abnormal   Collection Time   08/16/11 10:16 AM      Component Value Range   Sodium 141  135 - 145 (mEq/L)   Potassium 3.6  3.5 - 5.1 (mEq/L)   Chloride 106  96 - 112 (mEq/L)   BUN 20  6 - 23 (mg/dL)   Creatinine, Ser 1.61  0.50 - 1.35 (mg/dL)   Glucose, Bld 096 (*) 70 - 99 (mg/dL)   Calcium, Ion 0.45  4.09 - 1.32 (mmol/L)   TCO2 24  0 - 100 (mmol/L)   Hemoglobin 16.7  13.0 - 17.0 (g/dL)   HCT 81.1  91.4 - 78.2 (%)  URINALYSIS, ROUTINE W REFLEX MICROSCOPIC     Status: Abnormal   Collection Time   08/16/11  1:18 PM      Component Value Range   Color, Urine YELLOW  YELLOW    APPearance CLEAR  CLEAR    Specific Gravity, Urine 1.025  1.005 - 1.030    pH 5.5  5.0 - 8.0    Glucose, UA NEGATIVE  NEGATIVE (mg/dL)   Hgb urine dipstick LARGE (*) NEGATIVE    Bilirubin Urine NEGATIVE  NEGATIVE    Ketones, ur NEGATIVE  NEGATIVE (mg/dL)   Protein, ur NEGATIVE  NEGATIVE (mg/dL)   Urobilinogen, UA 0.2  0.0 - 1.0 (mg/dL)   Nitrite NEGATIVE  NEGATIVE    Leukocytes, UA NEGATIVE  NEGATIVE   URINE MICROSCOPIC-ADD ON     Status: Abnormal   Collection Time   08/16/11  1:18 PM      Component Value Range   Squamous Epithelial / LPF RARE  RARE    WBC, UA 0-2  <3 (WBC/hpf)     RBC / HPF 21-50  <3 (RBC/hpf)   Bacteria, UA FEW (*) RARE    Casts HYALINE CASTS (*) NEGATIVE    Crystals CA OXALATE CRYSTALS (*) NEGATIVE    Urine-Other MUCOUS PRESENT     Imaging:  Clinical Data: Right flank pain. History of renal calculi.  Hypertension.  CT ABDOMEN AND PELVIS WITHOUT CONTRAST  Technique: Multidetector CT  imaging of the abdomen and pelvis was  performed following the standard protocol without intravenous  contrast.  Comparison: None.  Findings: There is evidence of old granulomatous disease in the  lung bases. The visualized portion of the liver, spleen, pancreas,  and adrenal glands appear unremarkable in noncontrast CT  appearance.  The gallbladder and biliary system appear unremarkable.  Mild right hydronephrosis and proximal hydroureter are present due  to a 0.9 x 0.3 x 0.6 cm right proximal ureteral calculus shown on  image 49 of series 602. The ureter distal to this stone is of  normal caliber, and no left-sided calculi are observed.  Retrocecal appendix extends cephalad and terminates at the inferior  tip of the liver; the appendix appears normal.  No pathologic retroperitoneal or porta hepatis adenopathy is  identified.  Scattered air-fluid levels in nondilated small bowel are probably  incidental  Mild atherosclerotic calcification of the abdominal aorta is  present. No ascites noted.  Bilateral L5 pars interarticularis defects are present, with 4 mm  of grade 1 anterolisthesis of L5 on S1 and potential right greater  than left foraminal stenosis. Small central calcifications are  present in the prostate gland.  IMPRESSION:  1. Mildly obstructive right proximal ureteral calculus measuring  0.9 x 0.3 x 0.6 cm.  2. 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  Original Report Authenticated By: Dellia Cloud, M.D.  Creatinine:  Basename 08/16/11 1016 08/13/11 0656  CREATININE 1.20 1.00    Impression/Assessment:  Right  sided ureteral stone with hydronephrosis/ureter and pain  Plan:  Cysto with right sided DJ stent placement   YARBROUGH,Anamari Galeas G. 08/16/2011, 4:16 PM

## 2011-08-16 NOTE — ED Notes (Signed)
Pt woke up and started c/o pain to the rt flank area radiating to the RLQ area. Pt medicated as ordered, will monitor

## 2011-08-16 NOTE — Anesthesia Preprocedure Evaluation (Signed)
Anesthesia Evaluation  Patient identified by MRN, date of birth, ID band Patient awake    Reviewed: Allergy & Precautions, H&P , NPO status , Patient's Chart, lab work & pertinent test results  Airway Mallampati: II TM Distance: >3 FB Neck ROM: Full    Dental No notable dental hx.    Pulmonary neg pulmonary ROS,  clear to auscultation  Pulmonary exam normal       Cardiovascular hypertension, Pt. on medications and Pt. on home beta blockers Regular Normal    Neuro/Psych Anxiety Negative Neurological ROS     GI/Hepatic negative GI ROS, Neg liver ROS,   Endo/Other  Negative Endocrine ROS  Renal/GU negative Renal ROS  Genitourinary negative   Musculoskeletal negative musculoskeletal ROS (+)   Abdominal   Peds negative pediatric ROS (+)  Hematology negative hematology ROS (+)   Anesthesia Other Findings   Reproductive/Obstetrics negative OB ROS                           Anesthesia Physical Anesthesia Plan  ASA: II  Anesthesia Plan: General   Post-op Pain Management:    Induction: Intravenous  Airway Management Planned: LMA  Additional Equipment:   Intra-op Plan:   Post-operative Plan: Extubation in OR  Informed Consent: I have reviewed the patients History and Physical, chart, labs and discussed the procedure including the risks, benefits and alternatives for the proposed anesthesia with the patient or authorized representative who has indicated his/her understanding and acceptance.   Dental advisory given  Plan Discussed with: CRNA  Anesthesia Plan Comments:         Anesthesia Quick Evaluation

## 2011-08-16 NOTE — Anesthesia Postprocedure Evaluation (Signed)
  Anesthesia Post-op Note  Patient: Scott Marks  Procedure(s) Performed: Procedure(s) (LRB): CYSTOSCOPY WITH RETROGRADE PYELOGRAM/URETERAL STENT PLACEMENT (Right)  Patient Location: PACU  Anesthesia Type: General  Level of Consciousness: awake and alert   Airway and Oxygen Therapy: Patient Spontanous Breathing  Post-op Pain: mild  Post-op Assessment: Post-op Vital signs reviewed, Patient's Cardiovascular Status Stable, Respiratory Function Stable, Patent Airway and No signs of Nausea or vomiting  Post-op Vital Signs: stable  Complications: No apparent anesthesia complications

## 2011-08-16 NOTE — ED Notes (Signed)
Pt is noted to be sleeping, VSS. IVF of NS infusing to RFA, no redness nor swelling noted.

## 2011-08-16 NOTE — ED Notes (Signed)
Report given to Amy RN at [facility]

## 2011-08-16 NOTE — ED Notes (Signed)
Bed Control was called for a bed.

## 2011-08-16 NOTE — Preoperative (Signed)
Beta Blockers   Reason not to administer Beta Blockers:Not Applicable 

## 2011-08-16 NOTE — ED Notes (Signed)
Was seen and examined by Dr. Lynelle Doctor

## 2011-08-16 NOTE — ED Notes (Signed)
Pt is for transfer to [facility] under the service of Dr. Annabell Howells.

## 2011-08-16 NOTE — Anesthesia Preprocedure Evaluation (Deleted)
Anesthesia Evaluation  Patient identified by MRN, date of birth, ID band Patient awake    Reviewed: Allergy & Precautions, H&P , NPO status , Patient's Chart, lab work & pertinent test results  Airway Mallampati: II TM Distance: >3 FB Neck ROM: Full    Dental No notable dental hx.    Pulmonary neg pulmonary ROS,  clear to auscultation  Pulmonary exam normal       Cardiovascular neg cardio ROS Regular Normal    Neuro/Psych Negative Neurological ROS  Negative Psych ROS   GI/Hepatic negative GI ROS, Neg liver ROS,   Endo/Other  Negative Endocrine ROS  Renal/GU negative Renal ROS  Genitourinary negative   Musculoskeletal negative musculoskeletal ROS (+)   Abdominal   Peds negative pediatric ROS (+)  Hematology negative hematology ROS (+)   Anesthesia Other Findings   Reproductive/Obstetrics negative OB ROS                           Anesthesia Physical Anesthesia Plan  ASA: II  Anesthesia Plan: General   Post-op Pain Management:    Induction: Intravenous  Airway Management Planned:   Additional Equipment:   Intra-op Plan:   Post-operative Plan: Extubation in OR  Informed Consent: I have reviewed the patients History and Physical, chart, labs and discussed the procedure including the risks, benefits and alternatives for the proposed anesthesia with the patient or authorized representative who has indicated his/her understanding and acceptance.   Dental advisory given  Plan Discussed with: CRNA  Anesthesia Plan Comments:         Anesthesia Quick Evaluation  

## 2011-08-16 NOTE — ED Notes (Signed)
Pt continues to c/o pain, VSS, medicated as ordered. Will continue to monitor. Informed family that we are waiting for a bed at [facility] for the patient.

## 2011-08-16 NOTE — H&P (Signed)
I examined and interviewed the patient. I agree wihh assessment above. We will proceed with stent placement.

## 2011-08-16 NOTE — Transfer of Care (Signed)
Immediate Anesthesia Transfer of Care Note  Patient: Scott Marks  Procedure(s) Performed: Procedure(s) (LRB): CYSTOSCOPY WITH RETROGRADE PYELOGRAM/URETERAL STENT PLACEMENT (Right)  Patient Location: PACU  Anesthesia Type: General  Level of Consciousness: awake, alert , oriented and patient cooperative  Airway & Oxygen Therapy: Patient Spontanous Breathing and Patient connected to face mask oxygen  Post-op Assessment: Report given to PACU RN and Post -op Vital signs reviewed and stable  Post vital signs: Reviewed and stable  Complications: No apparent anesthesia complications

## 2011-08-17 ENCOUNTER — Encounter (HOSPITAL_COMMUNITY): Payer: Self-pay | Admitting: Anesthesiology

## 2011-08-17 MED ORDER — OXYBUTYNIN CHLORIDE ER 5 MG PO TB24: 5.0000 mg | ORAL_TABLET | Freq: Every day | ORAL | Status: DC

## 2011-08-17 MED ORDER — SULFAMETHOXAZOLE-TRIMETHOPRIM 800-160 MG PO TABS: 1.0000 | ORAL_TABLET | Freq: Two times a day (BID) | ORAL | Status: DC

## 2011-08-17 NOTE — Discharge Summary (Signed)
Patient ID: Nezar Buckles MRN: [id number] DOB/AGE: Sep 09, 1970 41 y.o.  Admit date: 08/16/2011 Discharge date: 08/17/2011  Primary Care Physician:  Ron Parker, MD, MD  Discharge Diagnoses:   Right proximal ureteral stone Consults:  None    Discharge Medications: Medication List  As of 08/17/2011  5:32 AM   ASK your doctor about these medications         ALPRAZolam 1 MG tablet   Commonly known as: XANAX   Take 1 mg by mouth 3 (three) times daily as needed. anxiety      atenolol 25 MG tablet   Commonly known as: TENORMIN   Take 25 mg by mouth daily.      cyclobenzaprine 10 MG tablet   Commonly known as: FLEXERIL   Take 10 mg by mouth at bedtime. Muscle spasms        fenofibrate 160 MG tablet   Take 160 mg by mouth daily.      lisinopril-hydrochlorothiazide 20-25 MG per tablet   Commonly known as: PRINZIDE,ZESTORETIC   Take 1 tablet by mouth daily.      oxyCODONE-acetaminophen 5-325 MG per tablet   Commonly known as: PERCOCET   Take 1-2 tablets by mouth every 6 (six) hours as needed. For pain      Tamsulosin HCl 0.4 MG Caps   Commonly known as: FLOMAX   Take 1 capsule (0.4 mg total) by mouth daily.             Significant Diagnostic Studies:  No results found.  Brief H and P: For complete details please refer to admission H and P, but in brief this 41 year old male was admitted because of intractable pain due to a right proximal ureteral stone.  Hospital Course:  Active Problems:  * No active hospital problems. *    Day of Discharge BP 103/69  Pulse 80  Temp(Src) 97.5 F (36.4 C) (Oral)  Resp 20  Ht 5\' 4"  (1.626 m)  Wt 76.6 kg (168 lb 14 oz)  BMI 28.99 kg/m2  SpO2 92%  Results for orders placed during the hospital encounter of 08/16/11 (from the past 24 hour(s))  POCT I-STAT, CHEM 8     Status: Abnormal   Collection Time   08/16/11 10:16 AM      Component Value Range   Sodium 141  135 - 145 (mEq/L)   Potassium 3.6  3.5 - 5.1 (mEq/L)   Chloride 106  96 - 112 (mEq/L)   BUN 20  6 - 23 (mg/dL)   Creatinine, Ser 4.09  0.50 - 1.35 (mg/dL)   Glucose, Bld 811 (*) 70 - 99 (mg/dL)   Calcium, Ion 9.14  7.82 - 1.32 (mmol/L)   TCO2 24  0 - 100 (mmol/L)   Hemoglobin 16.7  13.0 - 17.0 (g/dL)   HCT 95.6  21.3 - 08.6 (%)  URINALYSIS, ROUTINE W REFLEX MICROSCOPIC     Status: Abnormal   Collection Time   08/16/11  1:18 PM      Component Value Range   Color, Urine YELLOW  YELLOW    APPearance CLEAR  CLEAR    Specific Gravity, Urine 1.025  1.005 - 1.030    pH 5.5  5.0 - 8.0    Glucose, UA NEGATIVE  NEGATIVE (mg/dL)   Hgb urine dipstick LARGE (*) NEGATIVE    Bilirubin Urine NEGATIVE  NEGATIVE    Ketones, ur NEGATIVE  NEGATIVE (mg/dL)   Protein, ur NEGATIVE  NEGATIVE (mg/dL)   Urobilinogen, UA 0.2  0.0 -  1.0 (mg/dL)   Nitrite NEGATIVE  NEGATIVE    Leukocytes, UA NEGATIVE  NEGATIVE   URINE MICROSCOPIC-ADD ON     Status: Abnormal   Collection Time   08/16/11  1:18 PM      Component Value Range   Squamous Epithelial / LPF RARE  RARE    WBC, UA 0-2  <3 (WBC/hpf)   RBC / HPF 21-50  <3 (RBC/hpf)   Bacteria, UA FEW (*) RARE    Casts HYALINE CASTS (*) NEGATIVE    Crystals CA OXALATE CRYSTALS (*) NEGATIVE    Urine-Other MUCOUS PRESENT      Physical Exam: General: Alert and awake oriented x3 not in any acute distress. HEENT: anicteric sclera, pupils reactive to light and accommodation CVS: S1-S2 clear no murmur rubs or gallops Chest: clear to auscultation bilaterally, no wheezing rales or rhonchi Abdomen: soft nontender, nondistended, normal bowel sounds, no organomegaly Extremities: no cyanosis, clubbing or edema noted bilaterally Neuro: Cranial nerves II-XII intact, no focal neurological deficits  Disposition: Home  Diet: No restrictions  Activity: No work until 2/28   Disposition and Follow-up:   We will schedule an appointment the patient followup in the office  TESTS THAT NEED FOLLOW-UP None  DISCHARGE  FOLLOW-UP Follow-up Information    Follow up with Laelyn Blumenthal, Bertram Millard, MD. (we will call you)    Contact information:   [address] [place] [address] [phone]          Time spent on Discharge: 10 minutes  Signed: Chelsea Aus 08/17/2011, 5:32 AM

## 2011-08-17 NOTE — Discharge Instructions (Signed)
1. You may see some blood in the urine and may have some burning with urination for 48-72 hours. You also may notice that you have to urinate more frequently or urgently after your procedure which is normal.   2. You should call should you develop an inability urinate, fever > 101, persistent nausea and vomiting that prevents you from eating or drinking to stay hydrated.   3. If you have a stent, you will likely urinate more frequently and urgently until the stent is removed and you may experience some discomfort/pain in the lower abdomen and flank especially when urinating. You may take pain medication prescribed to you if needed for pain. You may also intermittently have blood in the urine until the stent is removed. well, there usually is not a problem.

## 2011-08-18 ENCOUNTER — Encounter (HOSPITAL_COMMUNITY): Payer: Self-pay | Admitting: Urology

## 2011-08-18 MED FILL — Lidocaine HCl Gel 2%: CUTANEOUS | Qty: 10 | Status: AC

## 2011-08-19 ENCOUNTER — Other Ambulatory Visit: Payer: Self-pay | Admitting: Urology

## 2011-08-25 ENCOUNTER — Encounter (HOSPITAL_COMMUNITY): Payer: Self-pay | Admitting: Pharmacy Technician

## 2011-08-29 ENCOUNTER — Encounter (HOSPITAL_COMMUNITY): Payer: Self-pay

## 2011-08-29 ENCOUNTER — Encounter (HOSPITAL_COMMUNITY)
Admission: RE | Admit: 2011-08-29 | Discharge: 2011-08-29 | Disposition: A | Discharge status: Home / Self Care | Payer: Self-pay | Source: Ambulatory Visit | Attending: Urology | Admitting: Urology

## 2011-08-29 ENCOUNTER — Other Ambulatory Visit: Payer: Self-pay

## 2011-08-29 HISTORY — DX: Gastro-esophageal reflux disease without esophagitis: K21.9

## 2011-08-29 HISTORY — DX: Myoneural disorder, unspecified: G70.9

## 2011-08-29 LAB — CBC
HCT: 41.2 % (ref 39.0–52.0)
Hemoglobin: 14.5 g/dL (ref 13.0–17.0)
MCV: 82.4 fL (ref 78.0–100.0)
RBC: 5 MIL/uL (ref 4.22–5.81)
RDW: 12.5 % (ref 11.5–15.5)
WBC: 8.5 10*3/uL (ref 4.0–10.5)

## 2011-08-29 LAB — BASIC METABOLIC PANEL
BUN: 16 mg/dL (ref 6–23)
CO2: 28 mEq/L (ref 19–32)
Chloride: 103 mEq/L (ref 96–112)
Creatinine, Ser: 0.97 mg/dL (ref 0.50–1.35)
Glucose, Bld: 88 mg/dL (ref 70–99)
Potassium: 3.8 mEq/L (ref 3.5–5.1)

## 2011-08-29 NOTE — Progress Notes (Signed)
STOP BANG TOOL SCORE =  4   08/29/11 1137  OBSTRUCTIVE SLEEP APNEA  Score 4 or greater  Updated health history

## 2011-08-29 NOTE — Progress Notes (Signed)
08/29/11 1137  OBSTRUCTIVE SLEEP APNEA  Score 4 or greater  Updated health history

## 2011-08-29 NOTE — Patient Instructions (Signed)
20 Scott Marks  08/29/2011   Your procedure is scheduled on:  09/02/11 1115am-1245pm  Report to [facility] at 0900 AM.  Call this number if you have problems the morning of surgery: [phone]   Remember:   Do not eat food:After Midnight.  May have clear liquids:until Midnight . Marland Kitchen  Take these medicines the morning of surgery with A SIP OF WATER:    Do not wear jewelry,  Do not wear lotions, powders, or perfumes.     Do not bring valuables to the hospital.  Contacts, dentures or bridgework may not be worn into surgery.      Patients discharged the day of surgery will not be allowed to drive home.  Name and phone number of your driver:   Special Instructions: CHG Shower Use Special Wash: 1/2 bottle night before surgery and 1/2 bottle morning of surgery.   Please read over the following fact sheets that you were given: MRSA Information, coughing and deep breathing exercises, leg exercises

## 2011-09-02 ENCOUNTER — Ambulatory Visit (HOSPITAL_COMMUNITY): Payer: Self-pay | Admitting: Anesthesiology

## 2011-09-02 ENCOUNTER — Encounter (HOSPITAL_COMMUNITY): Payer: Self-pay | Admitting: *Deleted

## 2011-09-02 ENCOUNTER — Encounter (HOSPITAL_COMMUNITY): Payer: Self-pay | Admitting: Anesthesiology

## 2011-09-02 ENCOUNTER — Ambulatory Visit (HOSPITAL_COMMUNITY)
Admission: RE | Admit: 2011-09-02 | Discharge: 2011-09-02 | Disposition: A | Discharge status: Home / Self Care | Payer: Self-pay | Source: Ambulatory Visit | Attending: Urology | Admitting: Urology

## 2011-09-02 ENCOUNTER — Encounter (HOSPITAL_COMMUNITY)
Admission: RE | Disposition: A | Discharge status: Home / Self Care | Payer: Self-pay | Source: Ambulatory Visit | Attending: Urology

## 2011-09-02 DIAGNOSIS — Z79899 Other long term (current) drug therapy: Secondary | ICD-10-CM | POA: Insufficient documentation

## 2011-09-02 DIAGNOSIS — E7800 Pure hypercholesterolemia, unspecified: Secondary | ICD-10-CM | POA: Insufficient documentation

## 2011-09-02 DIAGNOSIS — N201 Calculus of ureter: Secondary | ICD-10-CM | POA: Insufficient documentation

## 2011-09-02 DIAGNOSIS — K219 Gastro-esophageal reflux disease without esophagitis: Secondary | ICD-10-CM | POA: Insufficient documentation

## 2011-09-02 DIAGNOSIS — I10 Essential (primary) hypertension: Secondary | ICD-10-CM | POA: Insufficient documentation

## 2011-09-02 DIAGNOSIS — Z01812 Encounter for preprocedural laboratory examination: Secondary | ICD-10-CM | POA: Insufficient documentation

## 2011-09-02 HISTORY — PX: STONE EXTRACTION WITH BASKET: SHX5318

## 2011-09-02 HISTORY — PX: CYSTOSCOPY WITH URETEROSCOPY: SHX5123

## 2011-09-02 SURGERY — CYSTOSCOPY WITH URETEROSCOPY
Anesthesia: General | Laterality: Right | Wound class: Clean Contaminated

## 2011-09-02 MED ORDER — IOHEXOL 300 MG/ML  SOLN
INTRAMUSCULAR | Status: AC
Start: 2011-09-02 — End: 2011-09-02
  Filled 2011-09-02: qty 1

## 2011-09-02 MED ORDER — LACTATED RINGERS IV SOLN
INTRAVENOUS | Status: DC
  Administered 2011-09-02: 1000 mL via INTRAVENOUS
  Administered 2011-09-02: 14:00:00 via INTRAVENOUS

## 2011-09-02 MED ORDER — FENTANYL CITRATE 0.05 MG/ML IJ SOLN
INTRAMUSCULAR | Status: AC
  Filled 2011-09-02: qty 2

## 2011-09-02 MED ORDER — PROPOFOL 10 MG/ML IV EMUL
INTRAVENOUS | Status: DC | PRN
  Administered 2011-09-02: 200 mg via INTRAVENOUS

## 2011-09-02 MED ORDER — CEFAZOLIN SODIUM 1-5 GM-% IV SOLN
INTRAVENOUS | Status: AC
Start: 2011-09-02 — End: 2011-09-02
  Filled 2011-09-02: qty 50

## 2011-09-02 MED ORDER — OXYBUTYNIN CHLORIDE 5 MG PO TABS: 5.0000 mg | ORAL_TABLET | Freq: Three times a day (TID) | ORAL | Status: DC

## 2011-09-02 MED ORDER — OXYBUTYNIN CHLORIDE 5 MG PO TABS
ORAL_TABLET | ORAL | Status: AC
  Filled 2011-09-02: qty 1

## 2011-09-02 MED ORDER — KETOROLAC TROMETHAMINE 30 MG/ML IJ SOLN
INTRAMUSCULAR | Status: AC
  Filled 2011-09-02: qty 1

## 2011-09-02 MED ORDER — OXYCODONE-ACETAMINOPHEN 5-325 MG PO TABS
ORAL_TABLET | ORAL | Status: AC
  Administered 2011-09-02: 1
  Filled 2011-09-02: qty 1

## 2011-09-02 MED ORDER — MIDAZOLAM HCL 5 MG/ML IJ SOLN
0.5000 mg | INTRAMUSCULAR | Status: DC | PRN
  Administered 2011-09-02 (×2): 0.5 mg via INTRAVENOUS

## 2011-09-02 MED ORDER — FENTANYL CITRATE 0.05 MG/ML IJ SOLN
INTRAMUSCULAR | Status: DC | PRN
  Administered 2011-09-02: 100 ug via INTRAVENOUS
  Administered 2011-09-02 (×2): 25 ug via INTRAVENOUS

## 2011-09-02 MED ORDER — OXYCODONE-ACETAMINOPHEN 5-325 MG PO TABS: 1.0000 | ORAL_TABLET | ORAL | Status: AC | PRN

## 2011-09-02 MED ORDER — MIDAZOLAM HCL 5 MG/ML IJ SOLN
INTRAMUSCULAR | Status: AC
  Filled 2011-09-02: qty 1

## 2011-09-02 MED ORDER — LIDOCAINE HCL 1 % IJ SOLN
INTRAMUSCULAR | Status: DC | PRN
  Administered 2011-09-02: 100 mg via INTRADERMAL

## 2011-09-02 MED ORDER — OXYCODONE-ACETAMINOPHEN 5-325 MG PO TABS: 1.0000 | ORAL_TABLET | ORAL | Status: DC | PRN

## 2011-09-02 MED ORDER — FENTANYL CITRATE 0.05 MG/ML IJ SOLN
25.0000 ug | INTRAMUSCULAR | Status: AC | PRN
  Administered 2011-09-02 (×2): 25 ug via INTRAVENOUS
  Administered 2011-09-02: 50 ug via INTRAVENOUS
  Administered 2011-09-02 (×2): 25 ug via INTRAVENOUS
  Administered 2011-09-02: 50 ug via INTRAVENOUS

## 2011-09-02 MED ORDER — CIPROFLOXACIN IN D5W 400 MG/200ML IV SOLN
INTRAVENOUS | Status: AC
  Filled 2011-09-02: qty 200

## 2011-09-02 MED ORDER — ONDANSETRON HCL 4 MG/2ML IJ SOLN
INTRAMUSCULAR | Status: DC | PRN
  Administered 2011-09-02: 4 mg via INTRAVENOUS

## 2011-09-02 MED ORDER — SODIUM CHLORIDE 0.9 % IR SOLN
Status: DC | PRN
  Administered 2011-09-02: 3000 mL

## 2011-09-02 MED ORDER — MIDAZOLAM HCL 5 MG/5ML IJ SOLN
INTRAMUSCULAR | Status: DC | PRN
  Administered 2011-09-02: 2 mg via INTRAVENOUS

## 2011-09-02 MED ORDER — CEFAZOLIN SODIUM 1-5 GM-% IV SOLN
1.0000 g | INTRAVENOUS | Status: AC
  Administered 2011-09-02: 1 g via INTRAVENOUS

## 2011-09-02 MED ORDER — KETOROLAC TROMETHAMINE 30 MG/ML IJ SOLN
30.0000 mg | Freq: Once | INTRAMUSCULAR | Status: AC
  Administered 2011-09-02: 30 mg via INTRAVENOUS

## 2011-09-02 MED ORDER — ACETAMINOPHEN 10 MG/ML IV SOLN
INTRAVENOUS | Status: DC | PRN
  Administered 2011-09-02: 1000 mg via INTRAVENOUS

## 2011-09-02 MED ORDER — CIPROFLOXACIN HCL 250 MG PO TABS: 250.0000 mg | ORAL_TABLET | Freq: Two times a day (BID) | ORAL | Status: AC

## 2011-09-02 MED ORDER — FENTANYL CITRATE 0.05 MG/ML IJ SOLN: 50.0000 ug | INTRAMUSCULAR | Status: DC | PRN

## 2011-09-02 MED ORDER — IOHEXOL 300 MG/ML  SOLN
INTRAMUSCULAR | Status: DC | PRN
  Administered 2011-09-02: 10 mL

## 2011-09-02 MED ORDER — MIDAZOLAM HCL 2 MG/2ML IJ SOLN: 2.0000 mg | INTRAMUSCULAR | Status: DC | PRN

## 2011-09-02 MED ORDER — ACETAMINOPHEN 10 MG/ML IV SOLN
INTRAVENOUS | Status: AC
  Filled 2011-09-02: qty 100

## 2011-09-02 MED ORDER — CIPROFLOXACIN IN D5W 400 MG/200ML IV SOLN: 400.0000 mg | INTRAVENOUS | Status: DC

## 2011-09-02 SURGICAL SUPPLY — 20 items
ADAPTER CATH URET PLST 4-6FR (CATHETERS) ×2 IMPLANT
ADPR CATH URET STRL DISP 4-6FR (CATHETERS) ×1
BAG URO CATCHER STRL LF (DRAPE) ×2 IMPLANT
BASKET ZERO TIP NITINOL 2.4FR (BASKET) ×1 IMPLANT
BSKT STON RTRVL ZERO TP 2.4FR (BASKET) ×1
CATH INTERMIT  6FR 70CM (CATHETERS) IMPLANT
CATH URET 5FR 28IN OPEN ENDED (CATHETERS) ×2 IMPLANT
CLOTH BEACON ORANGE TIMEOUT ST (SAFETY) ×2 IMPLANT
DRAPE CAMERA CLOSED 9X96 (DRAPES) ×2 IMPLANT
GLOVE BIOGEL M 8.0 STRL (GLOVE) ×2 IMPLANT
GLOVE SURG SS PI 8.0 STRL IVOR (GLOVE) ×2 IMPLANT
GOWN PREVENTION PLUS XLARGE (GOWN DISPOSABLE) ×2 IMPLANT
GOWN STRL REIN XL XLG (GOWN DISPOSABLE) ×2 IMPLANT
LASER FIBER DISP (UROLOGICAL SUPPLIES) ×1 IMPLANT
MANIFOLD NEPTUNE II (INSTRUMENTS) ×2 IMPLANT
MARKER SKIN DUAL TIP RULER LAB (MISCELLANEOUS) ×2 IMPLANT
PACK CYSTO (CUSTOM PROCEDURE TRAY) ×2 IMPLANT
SHEATH ACCESS URETERAL 38CM (SHEATH) ×1 IMPLANT
STENT CONTOUR 6FRX24X.038 (STENTS) ×1 IMPLANT
TUBING CONNECTING 10 (TUBING) ×2 IMPLANT

## 2011-09-02 NOTE — Discharge Instructions (Signed)
POSTOPERATIVE CARE AFTER URETEROSCOPY  Stent management  *Stents are often left in after ureteroscopy and stone treatment. If left in, they often cause urinary frequency, urgency, occasional blood in the urine, as well as flank discomfort with urination. These are all expected issues, and should resolve after the stent is removed.  *Often times, a small thread is left on the end of the stent, and brought out through the urethra. If so, this is used to remove the stent, making it unnecessary to look in the bladder with a scope in the office to remove the stent. If a thread is left on, did not pull on it until instructed. You may pull the stent out on Monday morning. Following stent removal, you'll more than likely have quite a few small stone fragments coming out. Strained your urine, save fragments.  Diet  Once you have adequately recovered from anesthesia, you may gradually advance your diet, as tolerated, to your regular diet.  Activities  You may gradually increase your activities to your normal unrestricted level the day following your procedure.  Medications  You should resume all preoperative medications. If you are on aspirin-like compounds, you should not resume these until the blood clears from your urine. If given an antibiotic by the surgeon, take these until they are completed. You may also be given, if you have a stent, medications to decrease the urinary frequency and urgency.  Pain  After ureteroscopy, there may be some pain on the side of the scope. Take your pain medicine for this. Usually, this pain resolves within a day or 2.  Fever  Please report any fever over 100 to the doctor.

## 2011-09-02 NOTE — Addendum Note (Signed)
Addendum  created 09/02/11 1329 by Gaetano Hawthorne, MD   Modules edited:Orders

## 2011-09-02 NOTE — Op Note (Signed)
Preoperative diagnosis: Right proximal ureteral stone  Postoperative diagnosis: Same  Principal procedure: Cystoscopy, right ureteroscopy-rigid and flexible, holmium laser of right proximal ureteral stone, double-J stent placement (24 cm x 6 [place] with tether)  Surgeon: Sohail Capraro  Anesthesia: Gen. with LMA  Drains: 24 x 6 French double-J stent  Complications: None  Specimens: None  Indications: 41 year old male with symptomatic right proximal ureteral stone, requiring stenting approximately 3 weeks ago. He presents at this time for definitive management of his stone. Risks and complications of the procedure of cystoscopy, right ureteroscopy and holmium laser/possible extraction of stone and been discussed with the patient. He understands these and desires to proceed.  Description of procedure: The patient was identified, properly marked in the holding area, and received preoperative IV antibiotics. He was taken to the operating room where general anesthetic was administered with the LMA. He was placed in the dorsolithotomy position. Genitalia and perineum were prepped and draped, time out was performed.  I passed a 22 French cystoscope through his urethra and into the bladder, which was normal except for the right double-J stent seen coming to the right ureteral orifice. The stent was grasped and removed through the patient's urethral meatus. I then passed a guidewire through this double-J stent, using fluoroscopic guidance this was navigated to the right renal pelvis where good curl was seen. Following this, a 6 [place] long rigid ureteroscope was passed under direct vision, and the stone at the proximal ureter was encountered. Is slight stenosis just distal to this stone. It was felt that the stone was too large to be extracted, and I used the 365  fiber to fragment the stone into 3 fragments. Unfortunately, these were washed up into the renal pelvis, and I was unable to adequately fragment  them or grasped them with the rigid scope. I then passed a medium length ureteral access sheath over the guidewire, and passed the flexible ureteroscope into the right renal pelvis could be sheath. The stone was encountered, actually 2-3 fragments, in the upper pole calyces. I used the laser fiber to fragment these into multiple small fragments. The fragments were approximately 6-10 in number, and I felt that they would pass adequately, without having to be extracted. Because of the slight ureteral narrowing distal to where the stone was, I left the double-J stent and over a guidewire that had been placed through the ureteral access catheter. The stent was advanced using fluoroscopic assistance guidance. Good proximal and distal coils were seen using fluoroscopy and cystoscopy. The string was left on and brought through the urethra. The bladder had been drained appropriately.  The patient tolerated procedure well. He was awakened, extubated, and taken to the PACU in stable condition. He will be sent home with 25 Percocet tablets as well as 10 Cipro 250 mg tablets to take. I recommended that he remove the stent by himself in approximately 3 days, Monday morning.

## 2011-09-02 NOTE — Anesthesia Preprocedure Evaluation (Addendum)
Anesthesia Evaluation  Patient identified by MRN, date of birth, ID band Patient awake    Reviewed: Allergy & Precautions, H&P , NPO status , Patient's Chart, lab work & pertinent test results, reviewed documented beta blocker date and time   Airway Mallampati: II TM Distance: >3 FB Neck ROM: full    Dental  (+) Teeth Intact and Dental Advisory Given   Pulmonary neg pulmonary ROS,  breath sounds clear to auscultation  Pulmonary exam normal       Cardiovascular Exercise Tolerance: Good hypertension, Pt. on home beta blockers negative cardio ROS  Rhythm:regular Rate:Normal     Neuro/Psych Anxiety negative neurological ROS  negative psych ROS   GI/Hepatic negative GI ROS, Neg liver ROS, GERD-  Controlled,  Endo/Other  negative endocrine ROS  Renal/GU negative Renal ROS  negative genitourinary   Musculoskeletal   Abdominal   Peds  Hematology negative hematology ROS (+)   Anesthesia Other Findings   Reproductive/Obstetrics negative OB ROS                          Anesthesia Physical Anesthesia Plan  ASA: II  Anesthesia Plan: General   Post-op Pain Management:    Induction: Intravenous  Airway Management Planned: LMA  Additional Equipment:   Intra-op Plan:   Post-operative Plan:   Informed Consent: I have reviewed the patients History and Physical, chart, labs and discussed the procedure including the risks, benefits and alternatives for the proposed anesthesia with the patient or authorized representative who has indicated his/her understanding and acceptance.   Dental Advisory Given  Plan Discussed with: CRNA and Surgeon  Anesthesia Plan Comments:         Anesthesia Quick Evaluation

## 2011-09-02 NOTE — Progress Notes (Signed)
Pt has no dressing but pt does have string noted from his penis.  Pt did urinate and urine noted to be bloody with some clots.

## 2011-09-02 NOTE — Preoperative (Signed)
Beta Blockers   Reason not to administer Beta Blockers:Not Applicable 

## 2011-09-02 NOTE — Anesthesia Postprocedure Evaluation (Signed)
  Anesthesia Post-op Note  Patient: Scott Marks  Procedure(s) Performed: Procedure(s) (LRB): CYSTOSCOPY WITH URETEROSCOPY (Right) HOLMIUM LASER APPLICATION (Right) STONE EXTRACTION WITH BASKET (Right)  Patient Location: PACU  Anesthesia Type: General  Level of Consciousness: awake and alert   Airway and Oxygen Therapy: Patient Spontanous Breathing  Post-op Pain: mild  Post-op Assessment: Post-op Vital signs reviewed, Patient's Cardiovascular Status Stable, Respiratory Function Stable, Patent Airway and No signs of Nausea or vomiting  Post-op Vital Signs: stable  Complications: No apparent anesthesia complications

## 2011-09-02 NOTE — H&P (Signed)
Urology History and Physical Exam  CC: Right-sided ureteral stone  HPI: 41 year old Male presents at this time for definitive management of a right proximal ureteral stone. He presented to the emergency room at [facility] in late February, 2013 with an obstructing 9 mm proximal right ureteral stone. He was difficult to manage medically, as he had intractable pain despite high-dose narcotics. He underwent cystoscopy and double-J stent placement. He presents at this time for definitive management of the stone i.e. Ureteroscopy with holmium laser and extraction of ureteral stone.  He is aware of risks and complications of the procedure, and desires to proceed.  PMH: Past Medical History  Diagnosis Date  . Hypertension   . High cholesterol   . Anxiety   . Muscle spasm of calf   . Kidney stone   . GERD (gastroesophageal reflux disease)     hx of no longer a problem   . Neuromuscular disorder     form of cerebral palsy     PSH: Past Surgical History  Procedure Date  . Hamstring lengthening   . Cystoscopy w/ ureteral stent placement 08/16/2011    Procedure: CYSTOSCOPY WITH RETROGRADE PYELOGRAM/URETERAL STENT PLACEMENT;  Surgeon: Marcine Matar, MD;  Location: [facility] ORS;  Service: Urology;  Laterality: Right;  . Other surgical history     hx of twisted testicle surgery  . Other surgical history 2 surgeries on both legs     Allergies: No Known Allergies  Medications: Prescriptions prior to admission  Medication Sig Dispense Refill  . ALPRAZolam (XANAX) 1 MG tablet Take 1 mg by mouth 3 (three) times daily as needed. anxiety      . atenolol (TENORMIN) 25 MG tablet Take 25 mg by mouth daily before breakfast.       . fenofibrate 160 MG tablet Take 160 mg by mouth daily after breakfast.       . HYDROcodone-acetaminophen (NORCO) 7.5-325 MG per tablet Take 1 tablet by mouth every 6 (six) hours as needed. Pain       . lisinopril-hydrochlorothiazide (PRINZIDE,ZESTORETIC) 20-25  MG per tablet Take 1 tablet by mouth daily before breakfast.       . oxybutynin (DITROPAN-XL) 5 MG 24 hr tablet Take 5 mg by mouth daily.      . cyclobenzaprine (FLEXERIL) 10 MG tablet Take 10 mg by mouth at bedtime. Muscle spasms          Social History: History   Social History  . Marital Status: Married    Spouse Name: N/A    Number of Children: N/A  . Years of Education: N/A   Occupational History  . Not on file.   Social History Main Topics  . Smoking status: Former Smoker    Quit date: 06/21/2003  . Smokeless tobacco: Current User    Types: Chew  . Alcohol Use: Yes     rare   . Drug Use: No  . Sexually Active: Yes   Other Topics Concern  . Not on file   Social History Narrative   ** Merged History Encounter **     Family History: History reviewed. No pertinent family history.  Review of Systems: Positive: Urinary frequency and urgency, occasional gross hematuria Negative: .  A further 10 point review of systems was negative except what is listed in the HPI.  Physical Exam: @VITALS2 @ General: No acute distress.  Awake. Head:  Normocephalic.  Atraumatic. ENT:  EOMI.  Mucous membranes moist Neck:  Supple.  No lymphadenopathy. CV:  S1 present. S2 present. Regular rate. Pulmonary: Equal effort bilaterally.  Clear to auscultation bilaterally. Abdomen: Soft.  Non- tender to palpation. Skin:  Normal turgor.  No visible rash. Extremity: No gross deformity of bilateral upper extremities.  No gross deformity of    bilateral lower extremities. Neurologic: Alert. Appropriate mood.  Penis:  circumcised.  No lesions. Urethra:   Orthotopic meatus. Scrotum: No lesions.  No ecchymosis.  No erythema. Testicles: Descended bilaterally.  No masses bilaterally. Epididymis: Palpable bilaterally. Non Tender to palpation.  Studies:  No results found for this basename: HGB:2,WBC:2,PLT:2 in the last 72 hours  No results found for this basename:  NA:2,K:2,CL:2,CO2:2,BUN:2,CREATININE:2,CALCIUM:2,MAGNESIUM:2,GFRNONAA:2,GFRAA:2 in the last 72 hours   No results found for this basename: PT:2,INR:2,APTT:2 in the last 72 hours   No components found with this basename: ABG:2    Assessment:  9 mm right proximal ureteral stone, status post stent placement  Plan: Anesthetic cystoscopy, right ureteroscopy, holmium laser and extraction of stone.

## 2011-09-02 NOTE — Transfer of Care (Signed)
Immediate Anesthesia Transfer of Care Note  Patient: Scott Marks  Procedure(s) Performed: Procedure(s) (LRB): CYSTOSCOPY WITH URETEROSCOPY (Right) HOLMIUM LASER APPLICATION (Right) STONE EXTRACTION WITH BASKET (Right)  Patient Location: PACU  Anesthesia Type: General  Level of Consciousness: sedated, patient cooperative and responds to stimulation  Airway & Oxygen Therapy: Patient Spontanous Breathing and Patient connected to face mask oxygen  Post-op Assessment: Report given to PACU RN, Post -op Vital signs reviewed and stable and Patient moving all extremities  Post vital signs: Reviewed and stable  Complications: No apparent anesthesia complications

## 2011-09-14 ENCOUNTER — Encounter (HOSPITAL_COMMUNITY): Payer: Self-pay | Admitting: Urology

## 2017-09-27 ENCOUNTER — Emergency Department (HOSPITAL_COMMUNITY): Payer: Medicaid Other

## 2017-09-27 ENCOUNTER — Encounter (HOSPITAL_COMMUNITY): Payer: Self-pay | Admitting: Emergency Medicine

## 2017-09-27 ENCOUNTER — Emergency Department (HOSPITAL_COMMUNITY)
Admission: EM | Admit: 2017-09-27 | Discharge: 2017-09-27 | Disposition: A | Discharge status: Home / Self Care | Payer: Medicaid Other | Attending: Emergency Medicine | Admitting: Emergency Medicine

## 2017-09-27 ENCOUNTER — Other Ambulatory Visit: Payer: Self-pay

## 2017-09-27 DIAGNOSIS — I10 Essential (primary) hypertension: Secondary | ICD-10-CM | POA: Diagnosis not present

## 2017-09-27 DIAGNOSIS — G8929 Other chronic pain: Secondary | ICD-10-CM | POA: Insufficient documentation

## 2017-09-27 DIAGNOSIS — Z79899 Other long term (current) drug therapy: Secondary | ICD-10-CM | POA: Diagnosis not present

## 2017-09-27 DIAGNOSIS — M25562 Pain in left knee: Secondary | ICD-10-CM | POA: Diagnosis present

## 2017-09-27 DIAGNOSIS — Z87891 Personal history of nicotine dependence: Secondary | ICD-10-CM | POA: Insufficient documentation

## 2017-09-27 MED ORDER — PREDNISONE 50 MG PO TABS: 50.0000 mg | ORAL_TABLET | Freq: Every day | ORAL | 0 refills | Status: DC

## 2017-09-27 NOTE — ED Notes (Signed)
Patient transported to X-ray 

## 2017-09-27 NOTE — ED Triage Notes (Signed)
Pt to ER for "months" of left knee pain. Reports pain with ambulation. Denies injury

## 2017-09-27 NOTE — ED Notes (Signed)
See PA note for assessment  

## 2017-09-27 NOTE — ED Provider Notes (Signed)
[facility] Provider Note   CSN: [id number] Arrival date & time: 09/27/17  0757     History   Chief Complaint Chief Complaint  Patient presents with  . Knee Pain    HPI Scott BartersKenneth L Zurcher Jr. is a 47 y.o. male.  HPI   Scott Marks is a 47yo male with a history of hypertension, hyperlipidemia, hamstring lengthening procedure who presents to the emergency department for evaluation of left knee pain.  Patient reports that he has been having this pain for several months now.  States that pain is primarily located over the tibial tuberosity and lateral knee.  It is 9/10 in severity and feels "deep and throbbing, as if someone took a hammer to my knee."  Pain is worsened with kneeling.  He denies inciting injury that he is aware of.  Reports that the knee is somewhat swollen.  States that he had a hamstring lengthening procedure on this leg as an adolescent.  Reports that he has been taking Norco and ibuprofen without significant pain relief.  He denies fever, chills, overlying redness, numbness, weakness, joint pain elsewhere. He is able to ambulate independently despite pain.   Past Medical History:  Diagnosis Date  . Anxiety   . GERD (gastroesophageal reflux disease)    hx of no longer a problem   . High cholesterol   . Hypertension   . Kidney stone   . Muscle spasm of calf   . Neuromuscular disorder (HCC)    form of cerebral palsy     There are no active problems to display for this patient.   Past Surgical History:  Procedure Laterality Date  . CYSTOSCOPY W/ URETERAL STENT PLACEMENT  08/16/2011   Procedure: CYSTOSCOPY WITH RETROGRADE PYELOGRAM/URETERAL STENT PLACEMENT;  Surgeon: Marcine MatarStephen Dahlstedt, MD;  Location: [facility];  Service: Urology;  Laterality: Right;  . CYSTOSCOPY WITH URETEROSCOPY  09/02/2011   Procedure: CYSTOSCOPY WITH URETEROSCOPY;  Surgeon: Marcine MatarStephen Dahlstedt, MD;  Location: [facility];  Service: Urology;  Laterality: Right;  C-ARM   . HAMSTRING LENGTHENING    . OTHER SURGICAL HISTORY     hx of twisted testicle surgery  . OTHER SURGICAL HISTORY  2 surgeries on both legs   . STONE EXTRACTION WITH BASKET  09/02/2011   Procedure: STONE EXTRACTION WITH BASKET;  Surgeon: Marcine MatarStephen Dahlstedt, MD;  Location: [facility];  Service: Urology;  Laterality: Right;  attempted but unable to remove stone        Home Medications    Prior to Admission medications   Medication Sig Start Date End Date Taking? Authorizing Provider  ALPRAZolam Prudy Feeler(XANAX) 1 MG tablet Take 1 mg by mouth 3 (three) times daily as needed. anxiety    [provider]  atenolol (TENORMIN) 25 MG tablet Take 25 mg by mouth daily before breakfast.     [provider]  cyclobenzaprine (FLEXERIL) 10 MG tablet Take 10 mg by mouth at bedtime. Muscle spasms     [provider]  fenofibrate 160 MG tablet Take 160 mg by mouth daily after breakfast.     [provider]  lisinopril-hydrochlorothiazide (PRINZIDE,ZESTORETIC) 20-25 MG per tablet Take 1 tablet by mouth daily before breakfast.     [provider]    Family History History reviewed. No pertinent family history.  Social History Social History   Tobacco Use  . Smoking status: Former Smoker    Last attempt to quit: 06/21/2003    Years since quitting: 14.2  . Smokeless  tobacco: Current User    Types: Chew  Substance Use Topics  . Alcohol use: Yes    Comment: rare   . Drug use: No     Allergies   Patient has no known allergies.   Review of Systems Review of Systems  Constitutional: Negative for chills and fever.  Musculoskeletal: Positive for arthralgias (left knee) and joint swelling (left knee). Negative for gait problem.  Skin: Negative for color change, rash and wound.  Neurological: Negative for weakness and numbness.     Physical Exam Updated Vital Signs BP 112/80 (BP Location: Left Arm)   Pulse 70   Temp 97.6 F (36.4 C) (Oral)   Resp 18   SpO2  97%   Physical Exam  Constitutional: He is oriented to person, place, and time. He appears well-developed and well-nourished. No distress.  HENT:  Head: Normocephalic and atraumatic.  Eyes: Right eye exhibits no discharge. Left eye exhibits no discharge.  Pulmonary/Chest: Effort normal. No respiratory distress.  Musculoskeletal:  Left knee with tenderness to palpation of the lateral aspect of the joint line and over the tibial tuberosity. Full active flexion and extension of the knee. No joint effusion or swelling appreciated. No abnormal alignment or patellar mobility. No bruising, erythema or warmth overlaying the joint. No varus/valgus laxity. Negative drawer's and McMurray's.  No crepitus.  2+ DP pulses bilaterally. All compartments are soft. Sensation to light touch intact in bilateral LE.  Neurological: He is alert and oriented to person, place, and time. Coordination normal.  Skin: Skin is warm and dry. Capillary refill takes less than 2 seconds. He is not diaphoretic.  Psychiatric: He has a normal mood and affect. His behavior is normal.  Nursing note and vitals reviewed.    ED Treatments / Results  Labs (all labs ordered are listed, but only abnormal results are displayed) Labs Reviewed - No data to display  EKG None  Radiology Dg Knee Complete 4 Views Left  Result Date: 09/27/2017 CLINICAL DATA:  Chronic left knee pain and swelling with discomfort radiating into the foot for the past 3 months. No known injury. Patient has history of 2 previous hamstring lengthening procedures most recently 16 years ago. EXAM: LEFT KNEE - COMPLETE 4+ VIEW COMPARISON:  None in PACs FINDINGS: The bones are subjectively adequately mineralized. The joint spaces are well maintained. There is no acute or healing fracture. There is minimal beaking of the tibial spines. The soft tissues are unremarkable. IMPRESSION: There is no acute bony abnormality of the left knee. 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 Electronically Signed   By: David  [place] M.D.   On: 09/27/2017 10:37    Procedures Procedures (including critical care time)  Medications Ordered in ED Medications - No data to display   Initial Impression / Assessment and Plan / ED Course  I have reviewed the triage vital signs and the nursing notes.  Pertinent labs & imaging results that were available during my care of the patient were reviewed by me and considered in my medical decision making (see chart for details).    X-ray without acute abnormality, does show tibial spine peaking which likely represents osteoarthritic change.  Left lower extremity neurovascularly intact.  No erythema, swelling, warmth or signs of infection.  Plan to discharge patient with instructions to use NSAIDs for pain.  Will also discharge with knee sleeve and short steroid burst.  Have given patient information to follow-up with orthopedics in his discharge paperwork should his  pain continue.  Patient agrees and voiced understanding to the above plan.  Final Clinical Impressions(s) / ED Diagnoses   Final diagnoses:  Chronic pain of left knee    ED Discharge Orders        Ordered    predniSONE (DELTASONE) 50 MG tablet  Daily     09/27/17 1129       Kellie Shropshire, Cordelia Poche 09/27/17 1130    Pricilla Loveless, MD 09/27/17 1610

## 2017-09-27 NOTE — Discharge Instructions (Addendum)
Please use knee sleeve for support.  Take ibuprofen for pain.  I have also written you a prescription for prednisone which helps with inflammation.  Please take as prescribed.  I have listed the information to the orthopedic provider, please follow-up with your pain continues.  Return to the emergency department if you have any new or concerning symptoms.

## 2017-11-07 ENCOUNTER — Ambulatory Visit (INDEPENDENT_AMBULATORY_CARE_PROVIDER_SITE_OTHER): Payer: Medicaid Other | Admitting: Physician Assistant

## 2017-11-07 ENCOUNTER — Encounter: Payer: Self-pay | Admitting: Physician Assistant

## 2017-11-07 VITALS — BP 118/68 | HR 76 | Temp 97.9°F | Resp 16 | Ht 65.0 in | Wt 174.0 lb

## 2017-11-07 DIAGNOSIS — G808 Other cerebral palsy: Secondary | ICD-10-CM

## 2017-11-07 DIAGNOSIS — E785 Hyperlipidemia, unspecified: Secondary | ICD-10-CM | POA: Diagnosis not present

## 2017-11-07 DIAGNOSIS — Z1329 Encounter for screening for other suspected endocrine disorder: Secondary | ICD-10-CM

## 2017-11-07 DIAGNOSIS — Z130 Encounter for screening for diseases of the blood and blood-forming organs and certain disorders involving the immune mechanism: Secondary | ICD-10-CM | POA: Diagnosis not present

## 2017-11-07 DIAGNOSIS — Q760 Spina bifida occulta: Secondary | ICD-10-CM

## 2017-11-07 DIAGNOSIS — Z114 Encounter for screening for human immunodeficiency virus [HIV]: Secondary | ICD-10-CM | POA: Diagnosis not present

## 2017-11-07 DIAGNOSIS — Z3009 Encounter for other general counseling and advice on contraception: Secondary | ICD-10-CM

## 2017-11-07 DIAGNOSIS — F419 Anxiety disorder, unspecified: Secondary | ICD-10-CM

## 2017-11-07 DIAGNOSIS — G8929 Other chronic pain: Secondary | ICD-10-CM | POA: Diagnosis not present

## 2017-11-07 DIAGNOSIS — I10 Essential (primary) hypertension: Secondary | ICD-10-CM | POA: Diagnosis not present

## 2017-11-07 DIAGNOSIS — G629 Polyneuropathy, unspecified: Secondary | ICD-10-CM | POA: Diagnosis not present

## 2017-11-07 MED ORDER — GABAPENTIN 300 MG PO CAPS: ORAL_CAPSULE | ORAL | 0 refills | Status: DC

## 2017-11-07 MED ORDER — ATENOLOL 25 MG PO TABS: 25.0000 mg | ORAL_TABLET | Freq: Every day | ORAL | 0 refills | Status: DC

## 2017-11-07 MED ORDER — ATORVASTATIN CALCIUM 10 MG PO TABS: ORAL_TABLET | ORAL | 0 refills | Status: DC

## 2017-11-07 MED ORDER — CITALOPRAM HYDROBROMIDE 40 MG PO TABS
ORAL_TABLET | ORAL | 0 refills | Status: DC
Start: 2017-11-07 — End: 2018-02-14

## 2017-11-07 MED ORDER — LISINOPRIL-HYDROCHLOROTHIAZIDE 20-25 MG PO TABS: 1.0000 | ORAL_TABLET | Freq: Every day | ORAL | 0 refills | Status: DC

## 2017-11-07 MED ORDER — ALPRAZOLAM 0.5 MG PO TABS: 0.5000 mg | ORAL_TABLET | Freq: Every day | ORAL | 0 refills | Status: AC | PRN
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
## 2017-11-07 NOTE — Patient Instructions (Signed)

## 2017-11-08 ENCOUNTER — Telehealth: Payer: Self-pay

## 2017-11-08 LAB — CBC WITH DIFFERENTIAL/PLATELET
Basophils Absolute: 0 10*3/uL (ref 0.0–0.2)
Basos: 0 %
EOS (ABSOLUTE): 0.2 10*3/uL (ref 0.0–0.4)
Eos: 2 %
Hematocrit: 44.6 % (ref 37.5–51.0)
Hemoglobin: 15.4 g/dL (ref 13.0–17.7)
Immature Grans (Abs): 0 10*3/uL (ref 0.0–0.1)
Immature Granulocytes: 0 %
Lymphocytes Absolute: 2.9 10*3/uL (ref 0.7–3.1)
Lymphs: 38 %
MCH: 29.8 pg (ref 26.6–33.0)
MCHC: 34.5 g/dL (ref 31.5–35.7)
MCV: 86 fL (ref 79–97)
Monocytes Absolute: 1 10*3/uL — ABNORMAL HIGH (ref 0.1–0.9)
Monocytes: 13 %
Neutrophils Absolute: 3.7 10*3/uL (ref 1.4–7.0)
Neutrophils: 47 %
Platelets: 265 10*3/uL (ref 150–450)
RBC: 5.16 x10E6/uL (ref 4.14–5.80)
RDW: 13.4 % (ref 12.3–15.4)
WBC: 7.8 10*3/uL (ref 3.4–10.8)

## 2017-11-08 LAB — COMPREHENSIVE METABOLIC PANEL
ALT: 22 IU/L (ref 0–44)
AST: 19 IU/L (ref 0–40)
Albumin/Globulin Ratio: 1.6 (ref 1.2–2.2)
Albumin: 4.5 g/dL (ref 3.5–5.5)
Alkaline Phosphatase: 79 IU/L (ref 39–117)
BUN/Creatinine Ratio: 14 (ref 9–20)
BUN: 14 mg/dL (ref 6–24)
Bilirubin Total: 0.3 mg/dL (ref 0.0–1.2)
CO2: 24 mmol/L (ref 20–29)
Calcium: 9.6 mg/dL (ref 8.7–10.2)
Chloride: 101 mmol/L (ref 96–106)
Creatinine, Ser: 1.03 mg/dL (ref 0.76–1.27)
GFR calc Af Amer: 100 mL/min/{1.73_m2} (ref >59)
GFR calc non Af Amer: 86 mL/min/{1.73_m2} (ref >59)
Globulin, Total: 2.9 g/dL (ref 1.5–4.5)
Glucose: 79 mg/dL (ref 65–99)
Potassium: 3.9 mmol/L (ref 3.5–5.2)
Sodium: 142 mmol/L (ref 134–144)
Total Protein: 7.4 g/dL (ref 6.0–8.5)

## 2017-11-08 LAB — HIV ANTIBODY (ROUTINE TESTING W REFLEX): HIV Screen 4th Generation wRfx: NONREACTIVE

## 2017-11-08 LAB — LIPID PANEL
Chol/HDL Ratio: 6.1 ratio — ABNORMAL HIGH (ref 0.0–5.0)
Cholesterol, Total: 158 mg/dL (ref 100–199)
HDL: 26 mg/dL — ABNORMAL LOW (ref >39)
LDL Calculated: 70 mg/dL (ref 0–99)
Triglycerides: 311 mg/dL — ABNORMAL HIGH (ref 0–149)
VLDL Cholesterol Cal: 62 mg/dL — ABNORMAL HIGH (ref 5–40)

## 2017-11-08 LAB — TSH: TSH: 0.736 u[IU]/mL (ref 0.450–4.500)

## 2017-11-08 NOTE — Telephone Encounter (Signed)
-----   Message from Trey Sailors, [place] sent at 11/08/2017 10:47 AM EDT ----- Sugar and remainder of CMET normal. Cholesterol well controlled. Triglycerides high on this lab but he was not fasting. TSH normal, CBC with no infection or anemia. HIV negative.

## 2017-11-08 NOTE — Telephone Encounter (Signed)
Tried calling; no answer.   Thanks,   -Mera Gunkel  

## 2017-11-15 NOTE — Telephone Encounter (Signed)
Pt advised.   Thanks,   -Laura  

## 2017-11-16 ENCOUNTER — Ambulatory Visit: Payer: Medicaid Other | Admitting: Psychiatry

## 2017-11-27 ENCOUNTER — Ambulatory Visit: Payer: Medicaid Other | Admitting: Psychiatry

## 2017-12-27 ENCOUNTER — Ambulatory Visit: Payer: Self-pay | Admitting: Student in an Organized Health Care Education/Training Program

## 2018-01-01 ENCOUNTER — Ambulatory Visit: Payer: Medicaid Other | Admitting: Urology

## 2018-01-05 ENCOUNTER — Encounter: Payer: Self-pay | Admitting: Urology

## 2018-02-14 ENCOUNTER — Other Ambulatory Visit: Payer: Self-pay

## 2018-02-14 DIAGNOSIS — G629 Polyneuropathy, unspecified: Secondary | ICD-10-CM

## 2018-02-14 DIAGNOSIS — E785 Hyperlipidemia, unspecified: Secondary | ICD-10-CM

## 2018-02-14 DIAGNOSIS — I10 Essential (primary) hypertension: Secondary | ICD-10-CM

## 2018-02-14 DIAGNOSIS — F419 Anxiety disorder, unspecified: Secondary | ICD-10-CM

## 2018-02-14 MED ORDER — ATORVASTATIN CALCIUM 10 MG PO TABS: ORAL_TABLET | ORAL | 0 refills | Status: DC

## 2018-02-14 MED ORDER — CITALOPRAM HYDROBROMIDE 40 MG PO TABS
ORAL_TABLET | ORAL | 0 refills | Status: DC
Start: 2018-02-14 — End: 2018-06-04

## 2018-02-14 MED ORDER — ATENOLOL 25 MG PO TABS: 25.0000 mg | ORAL_TABLET | Freq: Every day | ORAL | 0 refills | Status: AC

## 2018-02-14 MED ORDER — LISINOPRIL-HYDROCHLOROTHIAZIDE 20-25 MG PO TABS: 1.0000 | ORAL_TABLET | Freq: Every day | ORAL | 0 refills | Status: DC

## 2018-02-14 MED ORDER — GABAPENTIN 300 MG PO CAPS: ORAL_CAPSULE | ORAL | 0 refills | Status: DC

## 2018-05-10 ENCOUNTER — Ambulatory Visit: Payer: Medicaid Other | Admitting: Physician Assistant

## 2018-06-02 ENCOUNTER — Other Ambulatory Visit: Payer: Self-pay | Admitting: Physician Assistant

## 2018-06-02 DIAGNOSIS — I10 Essential (primary) hypertension: Secondary | ICD-10-CM

## 2018-06-02 DIAGNOSIS — F419 Anxiety disorder, unspecified: Secondary | ICD-10-CM

## 2018-06-04 ENCOUNTER — Telehealth: Payer: Self-pay | Admitting: Physician Assistant

## 2018-06-04 DIAGNOSIS — E785 Hyperlipidemia, unspecified: Secondary | ICD-10-CM

## 2018-06-04 DIAGNOSIS — G629 Polyneuropathy, unspecified: Secondary | ICD-10-CM

## 2018-06-04 MED ORDER — GABAPENTIN 300 MG PO CAPS: ORAL_CAPSULE | ORAL | 0 refills | Status: AC

## 2018-06-04 MED ORDER — ATORVASTATIN CALCIUM 10 MG PO TABS: ORAL_TABLET | ORAL | 0 refills | Status: AC

## 2018-06-04 NOTE — Telephone Encounter (Signed)
Patient no showed his most recent appointment. Please schedule follow up and he must attend for more refills. I will give 30 days. Please inform of late and no show policy.

## 2018-06-04 NOTE — Telephone Encounter (Deleted)
b

## 2018-06-04 NOTE — Telephone Encounter (Addendum)
[facility] faxed refill request for the following medications:  atorvastatin (LIPITOR) 10 MG tablet Qty: 90 Last refill: 04/25/2018  gabapentin (NEURONTIN) 300 MG capsule  Qty: 270 Last refill: 04/25/2018  Please advise.

## 2019-10-04 IMAGING — DX DG KNEE COMPLETE 4+V*L*
4 series · 4 of 4 positions shown · non-contrast
Comparison: None in PACs

CLINICAL DATA: Chronic left knee pain and swelling with discomfort
radiating into the foot for the past 3 months. No known injury.
most recently 16 years ago.

EXAM:
LEFT KNEE - COMPLETE 4+ VIEW

[knee ap]
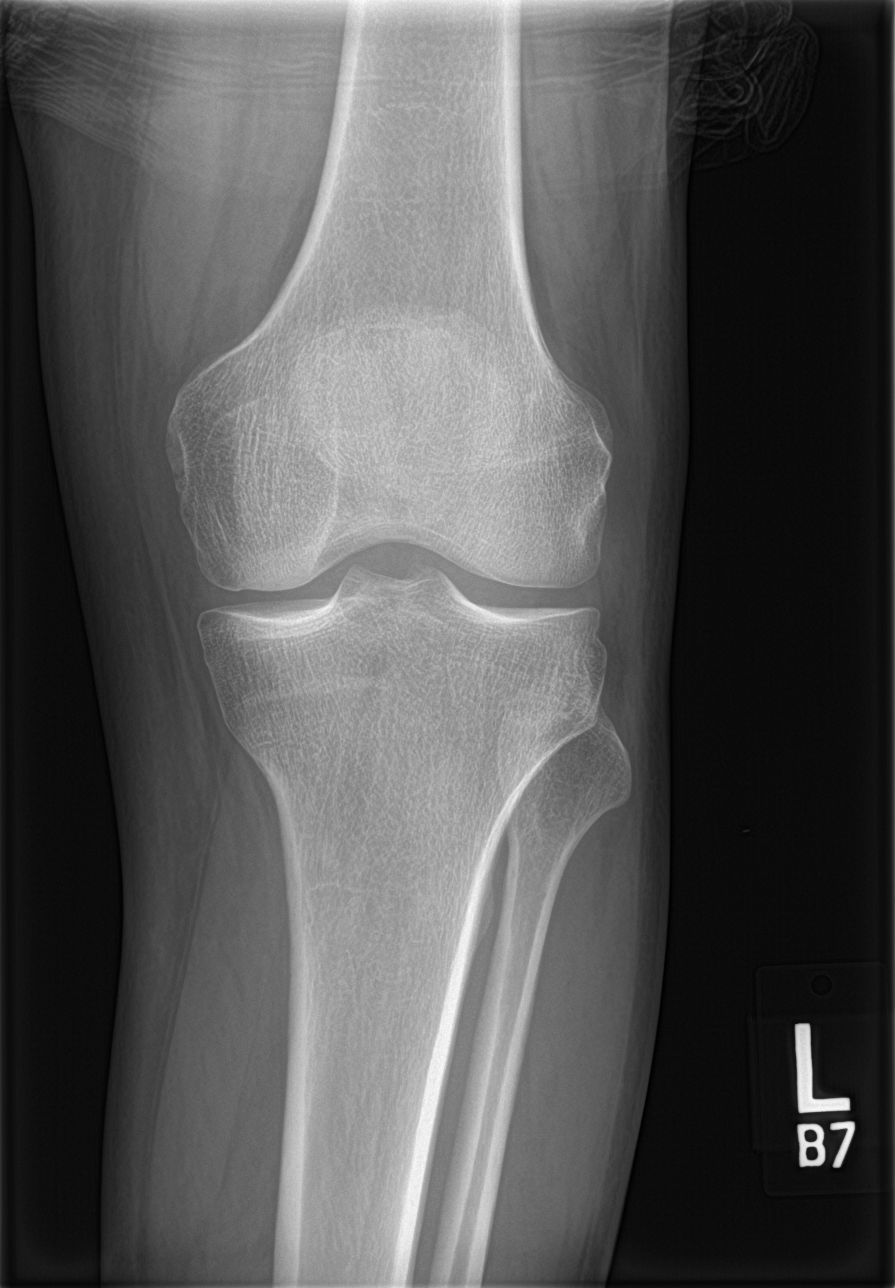

[knee lat]
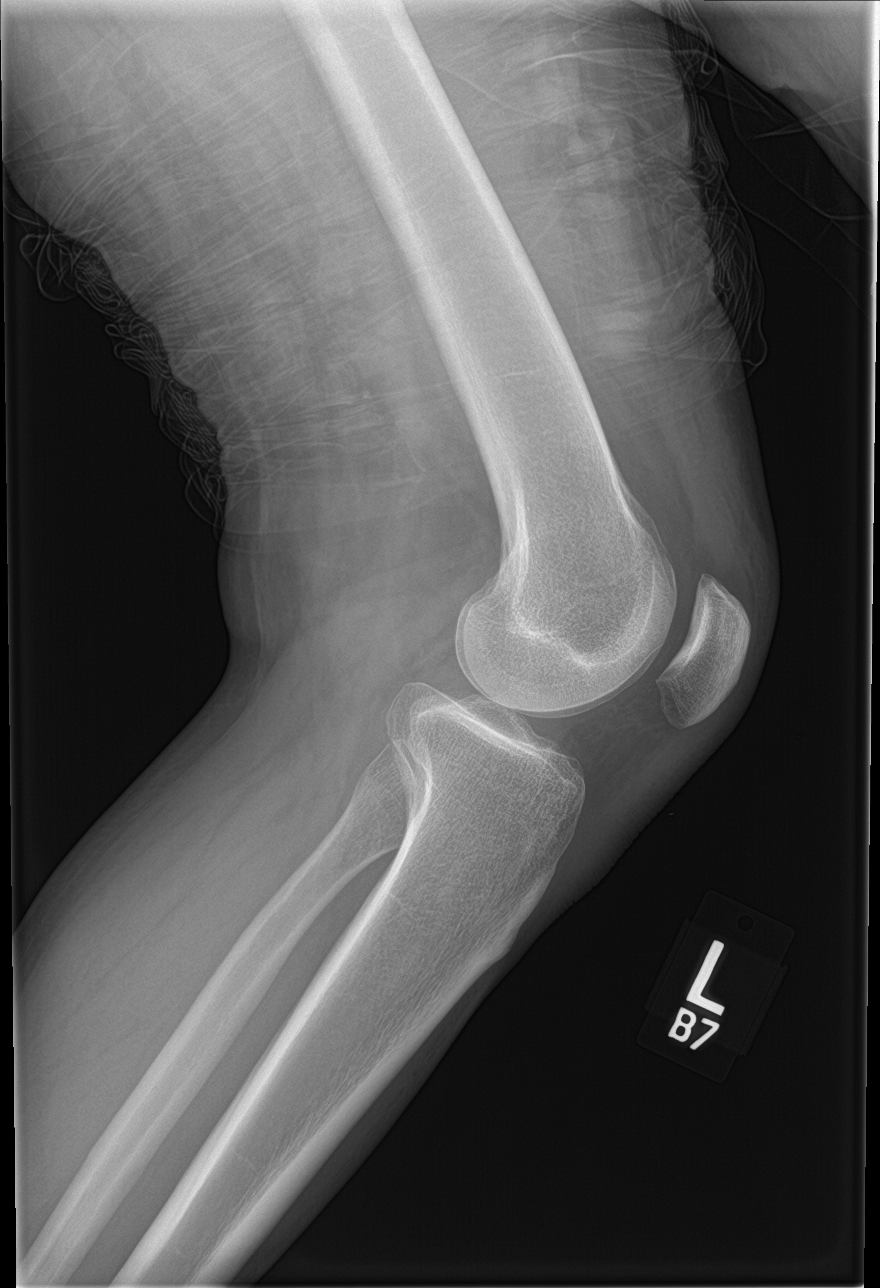

[knee obl (1 of 2)]
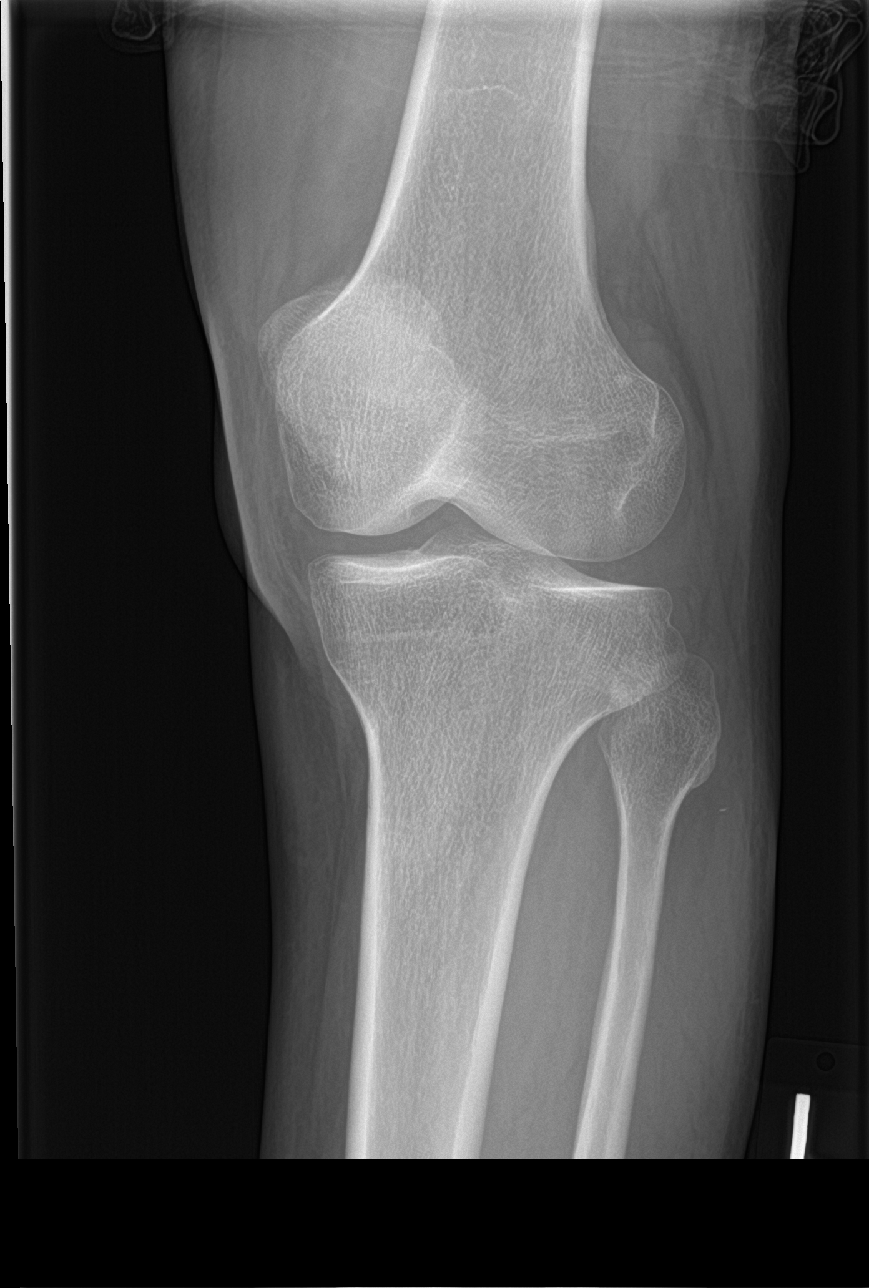

[knee obl (2 of 2)]
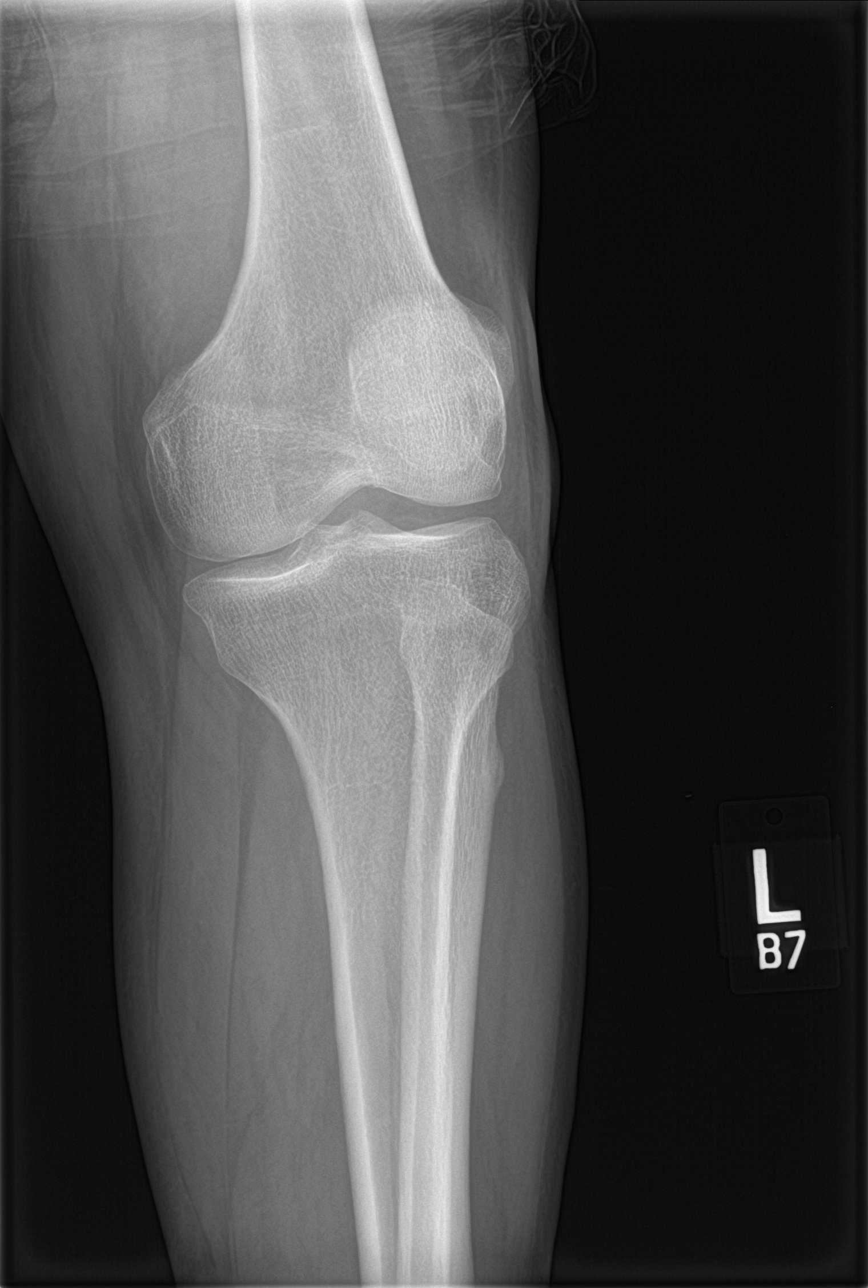

[4 of 4 positions shown; findings below may reference images not displayed]

FINDINGS: The bones are subjectively adequately mineralized. The joint spaces
are well maintained. There is no acute or healing fracture. There is
minimal beaking of the tibial spines. The soft tissues are
unremarkable.
IMPRESSION: There is no acute bony abnormality of the left knee. Minimal tibial
spine beaking likely reflects early osteoarthritic change.
